# Patient Record
Sex: Male | Born: 1980 | Race: Black or African American | Hispanic: No | Marital: Single | Smoking: Current every day smoker
Health system: Southern US, Community
[De-identification: ages and names within clinical notes are randomized; demographics above are authoritative.]

## PROBLEM LIST (undated history)

## (undated) DIAGNOSIS — H4020X0 Unspecified primary angle-closure glaucoma, stage unspecified: Secondary | ICD-10-CM

## (undated) DIAGNOSIS — I10 Essential (primary) hypertension: Secondary | ICD-10-CM

---

## 2012-10-19 ENCOUNTER — Emergency Department (HOSPITAL_COMMUNITY)
Admission: EM | Admit: 2012-10-19 | Discharge: 2012-10-19 | Disposition: A | Discharge status: Home / Self Care | Payer: Self-pay | Attending: Emergency Medicine | Admitting: Emergency Medicine

## 2012-10-19 ENCOUNTER — Encounter (HOSPITAL_COMMUNITY): Payer: Self-pay | Admitting: Adult Health

## 2012-10-19 DIAGNOSIS — H5710 Ocular pain, unspecified eye: Secondary | ICD-10-CM | POA: Insufficient documentation

## 2012-10-19 DIAGNOSIS — I10 Essential (primary) hypertension: Secondary | ICD-10-CM | POA: Insufficient documentation

## 2012-10-19 DIAGNOSIS — F17200 Nicotine dependence, unspecified, uncomplicated: Secondary | ICD-10-CM | POA: Insufficient documentation

## 2012-10-19 HISTORY — DX: Essential (primary) hypertension: I10

## 2012-10-19 MED ORDER — TETRACAINE HCL 0.5 % OP SOLN
2.0000 [drp] | Freq: Once | OPHTHALMIC | Status: AC
  Administered 2012-10-19: 2 [drp] via OPHTHALMIC
  Filled 2012-10-19: qty 2

## 2012-10-19 MED ORDER — ONDANSETRON 4 MG PO TBDP
4.0000 mg | ORAL_TABLET | Freq: Once | ORAL | Status: AC
  Administered 2012-10-19: 4 mg via ORAL
  Filled 2012-10-19: qty 1

## 2012-10-19 MED ORDER — OXYCODONE-ACETAMINOPHEN 5-325 MG PO TABS
2.0000 | ORAL_TABLET | Freq: Once | ORAL | Status: AC
  Administered 2012-10-19: 2 via ORAL
  Filled 2012-10-19: qty 2

## 2012-10-19 MED ORDER — KETOROLAC TROMETHAMINE 0.5 % OP SOLN
1.0000 [drp] | Freq: Four times a day (QID) | OPHTHALMIC | Status: DC
  Administered 2012-10-19: 1 [drp] via OPHTHALMIC
  Filled 2012-10-19: qty 3

## 2012-10-19 MED ORDER — BRIMONIDINE TARTRATE 0.2 % OP SOLN
1.0000 [drp] | Freq: Three times a day (TID) | OPHTHALMIC | Status: DC
  Administered 2012-10-19: 1 [drp] via OPHTHALMIC
  Filled 2012-10-19: qty 5

## 2012-10-19 NOTE — ED Provider Notes (Signed)
Medical screening examination/treatment/procedure(s) were performed by non-physician practitioner and as supervising physician I was immediately available for consultation/collaboration.   Dione Booze, MD 10/19/12 2312

## 2012-10-19 NOTE — ED Provider Notes (Signed)
History     CSN: [id number]  Arrival date & time 10/19/12  4098   First MD Initiated Contact with Patient 10/19/12 2024      Chief Complaint  Patient presents with  . Headache    (Consider location/radiation/quality/duration/timing/severity/associated sxs/prior treatment) HPI   In patient with a history of hypertension presents to the emergency department with complaints of left eye pain and tearing. He says that this has been happening on and off for the past 10 years. The episodes are infrequent. He says that his eye is sensitive to the light and that it tears up when the pain is the worst. He normally the pain resolved on its own. Currently he complains of blurred vision in his left eye which is resolving since the pain is going away. His blood pressure is 161/102 in the ER he is having no other symptoms. nad    Past Medical History  Diagnosis Date  . Hypertension     History reviewed. No pertinent past surgical history.  History reviewed. No pertinent family history.  History  Substance Use Topics  . Smoking status: Current Every Day Smoker    Types: Cigarettes  . Smokeless tobacco: Not on file  . Alcohol Use: Yes      Review of Systems  Review of Systems  Gen: no weight loss, fevers, chills, night sweats  Eyes: no discharge or drainage, + occular pain and visual changes  Nose: no epistaxis or rhinorrhea  Mouth: no dental pain, no sore throat  Neck: no neck pain  Lungs:No wheezing, coughing or hemoptysis CV: no chest pain, palpitations, dependent edema or orthopnea  Abd: no abdominal pain, nausea, vomiting  GU: no dysuria or gross hematuria  MSK:  No abnormalities  Neuro: no headache, no focal neurologic deficits  Skin: no abnormalities Psyche: negative.   Allergies  Poison oak extract  Home Medications  No current outpatient prescriptions on file.  BP 150/92  Pulse 77  Temp 98.4 F (36.9 C) (Oral)  Resp 20  SpO2 100%  Physical Exam  Nursing  note and vitals reviewed. Constitutional: He appears well-developed and well-nourished. No distress.  HENT:  Head: Normocephalic and atraumatic.  Eyes: Conjunctivae normal, EOM and lids are normal. Pupils are equal, round, and reactive to light.       Pt has physiologic red reflex.  Neck: Normal range of motion. Neck supple.  Cardiovascular: Normal rate and regular rhythm.   Pulmonary/Chest: Effort normal.  Abdominal: Soft.  Neurological: He is alert.  Skin: Skin is warm and dry.    ED Course  Procedures (including critical care time)  Labs Reviewed - No data to display No results found.   1. Eye pain       MDM  Pt given 2 percocet. When eye pain resolved his head pain behind the eye resolved as well. The tonometer in the ER would not work and we could not find a replacement. I am most concerned about a closer glaucoma. I have consulted with Dr. Delaney Meigs who has agree to see patient tomorrow morning at 9:15am in his office. He has asked me to start him on Alphagan and Ketoralac drops Optic.  Vision is 20/20 right eye 20/30 left eye and 20/20 both eyes/  I have discussed this with the patient and his fiance and they have agreed to go to office tomorrow morning.  Pt has been advised of the symptoms that warrant their return to the ED. Patient has voiced understanding and has agreed to  follow-up with the PCP or specialist.        Dorthula Matas, PA 10/19/12 2308

## 2012-10-19 NOTE — ED Notes (Signed)
Pt reports left eye pain that is associated with a headache,  Pt staes the headache comes first and then the eye pain begins. began at 140 today, pt states, "It hurts to open my left eye and it starts to tear up then I Can't see or do anything. This has been off and on for 10 years"  PT reports being sensitive only to light in the left eye. Denies injury. Bilateral eyes red. C/o blurred vision in left.  Pt is hypertensive 161/102

## 2014-07-07 ENCOUNTER — Encounter (HOSPITAL_COMMUNITY): Payer: Self-pay | Admitting: Emergency Medicine

## 2014-07-07 ENCOUNTER — Emergency Department (HOSPITAL_COMMUNITY)
Admission: EM | Admit: 2014-07-07 | Discharge: 2014-07-08 | Disposition: A | Discharge status: Home / Self Care | Payer: Self-pay | Attending: Emergency Medicine | Admitting: Emergency Medicine

## 2014-07-07 ENCOUNTER — Emergency Department (HOSPITAL_COMMUNITY): Payer: Self-pay

## 2014-07-07 DIAGNOSIS — F17200 Nicotine dependence, unspecified, uncomplicated: Secondary | ICD-10-CM | POA: Insufficient documentation

## 2014-07-07 DIAGNOSIS — Y929 Unspecified place or not applicable: Secondary | ICD-10-CM | POA: Insufficient documentation

## 2014-07-07 DIAGNOSIS — S63659A Sprain of metacarpophalangeal joint of unspecified finger, initial encounter: Secondary | ICD-10-CM | POA: Insufficient documentation

## 2014-07-07 DIAGNOSIS — I10 Essential (primary) hypertension: Secondary | ICD-10-CM | POA: Insufficient documentation

## 2014-07-07 DIAGNOSIS — Y9389 Activity, other specified: Secondary | ICD-10-CM | POA: Insufficient documentation

## 2014-07-07 DIAGNOSIS — W2209XA Striking against other stationary object, initial encounter: Secondary | ICD-10-CM | POA: Insufficient documentation

## 2014-07-07 DIAGNOSIS — S6990XA Unspecified injury of unspecified wrist, hand and finger(s), initial encounter: Secondary | ICD-10-CM | POA: Insufficient documentation

## 2014-07-07 NOTE — ED Notes (Signed)
Pt's wife reports pt hit a wooden banister x 3 weeks ago, started to complain tonight of L hand pain.  Mild swelling noted in his L hand.  No difficulty using his L hand.

## 2014-07-07 NOTE — ED Provider Notes (Signed)
CSN: [id number]     Arrival date & time 07/07/14  2332 History  This chart was scribed for non-physician provider Antony MaduraKelly Travone Georg, PA-C, working with Layla MawKristen N Ward, DO by Phillis HaggisGabriella Gaje, ED Scribe. This patient was seen in room WTR7/WTR7 and patient care was started at 11:44 PM.   Chief Complaint  Patient presents with  . Hand Pain   The history is provided by the patient. No language interpreter was used.   HPI Comments: Todd Macdonald is a 33 y.o. male who presents to the Emergency Department complaining of left hand pain onset 1.5 months ago. He states that he hit a wooden bannister and the swelling has not gone down since. He states that he has no pain in the arm on a regular basis but has pain on palpation. He reports that he has not taken anything for the pain. He reports that he did not want to be seen at the ED for pain, but was told numerous times by friends to come and see someone. He reports that he noticed discoloration in the left hand but states that it has since lessened.   Past Medical History  Diagnosis Date  . Hypertension    History reviewed. No pertinent past surgical history. No family history on file. History  Substance Use Topics  . Smoking status: Current Every Day Smoker    Types: Cigarettes  . Smokeless tobacco: Not on file  . Alcohol Use: Yes    Review of Systems  Musculoskeletal: Positive for arthralgias.  All other systems reviewed and are negative.   Allergies  Poison oak extract  Home Medications   Prior to Admission medications   Not on File   BP 141/92  Pulse 85  Temp(Src) 98.7 F (37.1 C) (Oral)  Resp 20  SpO2 100%  Physical Exam  Nursing note and vitals reviewed. Constitutional: He is oriented to person, place, and time. He appears well-developed and well-nourished. No distress.  Nontoxic/nonseptic appearing  HENT:  Head: Normocephalic and atraumatic.  Eyes: Conjunctivae and EOM are normal. No scleral icterus.  Neck: Normal range  of motion. Neck supple.  Cardiovascular: Normal rate, regular rhythm and intact distal pulses.   Distal radial pulse 2+ in left upper extremity. Capillary refill brisk in all digits  Pulmonary/Chest: Effort normal. No respiratory distress.  Musculoskeletal: Normal range of motion. He exhibits tenderness.  Tenderness to palpation of the third MCP joint of the left hand. Mild swelling appreciated. No deformity or crepitus.  Neurological: He is alert and oriented to person, place, and time. He exhibits normal muscle tone. Coordination normal.  No gross sensory deficits appreciated. Finger to thumb opposition intact. Grip strength normal and equal.  Skin: Skin is warm and dry. No rash noted. He is not diaphoretic. No erythema. No pallor.  Psychiatric: He has a normal mood and affect. His behavior is normal.    ED Course  Procedures (including critical care time) DIAGNOSTIC STUDIES: Oxygen Saturation is 100% on room air, normal by my interpretation.    COORDINATION OF CARE: 11:47 PM-Discussed treatment plan which includes  X-ray with pt at bedside and pt agreed to plan.   Labs Review Labs Reviewed - No data to display  Imaging Review Dg Hand Complete Left  07/08/2014   CLINICAL DATA:  Left hand pain over the third MCP joint after punched a wall 3 weeks ago.  EXAM: LEFT HAND - COMPLETE 3+ VIEW  COMPARISON:  None.  FINDINGS: Soft tissue swelling over the MCP joint  region. No evidence of acute fracture or dislocation in the left hand. No focal bone lesion or bone destruction. No radiopaque soft tissue foreign bodies.  IMPRESSION: Soft tissue swelling.  No acute bony abnormalities identified.   Electronically Signed   By: Burman Nieves M.D.   On: 07/08/2014 00:08     EKG Interpretation None      MDM   Final diagnoses:  Sprain of MCP joint of hand, initial encounter    Uncomplicated sprain of MCP joint. Patient neurovascularly intact. No gross sensory deficits appreciated. Imaging  today negative for fracture or dislocation. Patient stable for discharge with instructions for RICE and ibuprofen. Return precautions discussed and provided. Patient agreeable to plan with no unaddressed concerns.  I personally performed the services described in this documentation, which was scribed in my presence. The recorded information has been reviewed and is accurate.   Filed Vitals:   07/07/14 2350  BP: 141/92  Pulse: 85  Temp: 98.7 F (37.1 C)  TempSrc: Oral  Resp: 20  SpO2: 100%        Antony Madura, PA-C 07/08/14 [phone]

## 2014-07-08 NOTE — ED Provider Notes (Signed)
Medical screening examination/treatment/procedure(s) were performed by non-physician practitioner and as supervising physician I was immediately available for consultation/collaboration.   EKG Interpretation None        Layla MawKristen N Ward, DO 07/08/14 [id number]

## 2014-07-08 NOTE — Discharge Instructions (Signed)
Intermetacarpal Sprain °The intermetacarpal ligaments run between the knuckles at the base of the fingers. These ligaments are vulnerable to sprain and injury in which the ligament becomes overstretched or torn. Intermetacarpal sprains are classified into 3 categories. Grade 1 sprains cause pain, but the tendon is not lengthened. Grade 2 sprains include a lengthened ligament, due to the ligament being stretched or partially ruptured. With grade 2 sprains there is still function, although function may be decreased. Grade 3 sprains include a complete tear of the ligament, and the joint usually displays a loss of function.  °SYMPTOMS  °· Severe pain at the time of injury. °· Often, a feeling of popping or tearing inside the hand. °· Tenderness and inflammation at the knuckles. °· Bruising within a couple days of injury. °· Impaired ability to use the hand. °CAUSES  °This condition occurs when the intermetacarpal ligaments are subjected to a greater stress than they can handle. This causes the ligaments to become stretched or torn. °RISK INCREASES WITH: °· Previous hand injury. °· Fighting sports (boxing, wrestling, martial arts). °· Sports in which you could fall on an outstretched hand (soccer, basketball, volleyball). °· Other sports with repeated hand trauma (water polo, gymnastics). °· Poor hand strength and flexibility. °· Inadequate or poorly fitted protective equipment. °PREVENTION  °· Warm up and stretch properly before activity. °· Maintain appropriate conditioning: °¨ Hand flexibility. °¨ Muscle strength and endurance. °· Applying tape, protective strapping, or a brace may help prevent injury. °· Provide the hand with support during sports and practice activities for 6 to 12 months following injury. °PROGNOSIS  °With proper treatment, healing should occur without impairment. The length of healing varies from 2 to 12 weeks, depending on the severity of injury. °RELATED COMPLICATIONS  °· Longer healing time, if  activities are resumed too soon. °· Recurring symptoms or repeated injury, resulting in a chronic problem. °· Injury to other nearby structures (bone, cartilage, tendon). °· Arthritis of the knuckle (intermetacarpal) joint, with repeated sprains. °· Prolonged disability (sometimes). °· Hand and finger stiffness or weakness. °TREATMENT °Treatment first involves ice and medicine to reduce pain and inflammation. An elastic compression bandage may be worn to reduce discomfort and to protect the area. Depending on the severity of injury, you may be required to restrain the area with a cast, splint, or brace. After the ligament has been allowed to heal, strengthening and stretching exercises may be needed to regain strength and a full range of motion. Exercises may be completed at home or with a therapist. Surgery is rarely needed. °MEDICATION  °· If pain medicine is needed, nonsteroidal anti-inflammatory medicines (aspirin and ibuprofen), or other minor pain relievers (acetaminophen), are often advised. °· Do not take pain medicine for 7 days before surgery. °· Stronger pain relievers may be prescribed if your caregiver thinks they are needed. Use only as directed and only as much as you need. °HEAT AND COLD °· Cold treatment (icing) should be applied for 10 to 15 minutes every 2 to 3 hours for inflammation and pain, and immediately after activity that aggravates your symptoms. Use ice packs or an ice massage. °· Heat treatment may be used before performing stretching and strengthening activities prescribed by your caregiver, physical therapist, or athletic trainer. Use a heat pack or a warm water soak. °SEEK MEDICAL CARE IF:  °· Symptoms remain or get worse, despite treatment for longer than 2 to 4 weeks. °· You experience pain, numbness, discoloration, or coldness in the hand or fingers. °·   You develop blue, gray, or dark fingernails. °· Any of the following occur after surgery: increased pain, swelling, redness,  drainage of fluids, bleeding in the affected area, or signs of infection, including fever. °· New, unexplained symptoms develop. (Drugs used in treatment may produce side effects.) °Document Released: 11/27/2005 Document Revised: 04/13/2014 Document Reviewed: 03/11/2009 °[employer]® Patient Information ©2015 [employer]. This information is not intended to replace advice given to you by your health care provider. Make sure you discuss any questions you have with your health care provider. ° °

## 2017-11-12 ENCOUNTER — Emergency Department (HOSPITAL_COMMUNITY)
Admission: EM | Admit: 2017-11-12 | Discharge: 2017-11-12 | Disposition: A | Discharge status: Home / Self Care | Payer: BLUE CROSS/BLUE SHIELD | Attending: Emergency Medicine | Admitting: Emergency Medicine

## 2017-11-12 ENCOUNTER — Other Ambulatory Visit: Payer: Self-pay

## 2017-11-12 ENCOUNTER — Emergency Department (HOSPITAL_COMMUNITY): Payer: BLUE CROSS/BLUE SHIELD

## 2017-11-12 ENCOUNTER — Encounter (HOSPITAL_COMMUNITY): Payer: Self-pay | Admitting: Emergency Medicine

## 2017-11-12 DIAGNOSIS — Y929 Unspecified place or not applicable: Secondary | ICD-10-CM | POA: Diagnosis not present

## 2017-11-12 DIAGNOSIS — S4991XA Unspecified injury of right shoulder and upper arm, initial encounter: Secondary | ICD-10-CM | POA: Diagnosis present

## 2017-11-12 DIAGNOSIS — Y9389 Activity, other specified: Secondary | ICD-10-CM | POA: Insufficient documentation

## 2017-11-12 DIAGNOSIS — F17210 Nicotine dependence, cigarettes, uncomplicated: Secondary | ICD-10-CM | POA: Insufficient documentation

## 2017-11-12 DIAGNOSIS — X509XXA Other and unspecified overexertion or strenuous movements or postures, initial encounter: Secondary | ICD-10-CM | POA: Insufficient documentation

## 2017-11-12 DIAGNOSIS — Y999 Unspecified external cause status: Secondary | ICD-10-CM | POA: Insufficient documentation

## 2017-11-12 DIAGNOSIS — S4491XA Injury of unspecified nerve at shoulder and upper arm level, right arm, initial encounter: Secondary | ICD-10-CM | POA: Diagnosis not present

## 2017-11-12 DIAGNOSIS — R200 Anesthesia of skin: Secondary | ICD-10-CM | POA: Diagnosis not present

## 2017-11-12 DIAGNOSIS — I10 Essential (primary) hypertension: Secondary | ICD-10-CM | POA: Insufficient documentation

## 2017-11-12 NOTE — ED Provider Notes (Signed)
[facility] Provider Note   CSN: [id number] Arrival date & time: 11/12/17  1205     History   Chief Complaint Chief Complaint  Patient presents with  . Numbness    HPI Todd Macdonald is a 36 y.o. male with a history of hypertension who presents the emergency department today for numbness and weakness of the lower right dominant arm.  Patient states that yesterday evening he fell asleep in his arm chair around 10pm. He awoke around 2:30 AM this morning with a numbness/tingling sensation in his arm.  He states the sensation goes from his entire arm distal to the elbow, into the wrist and then localizes to the thumb.  He notes that he has decreased range of motion of the wrist with extension and also of the thumb.  He notes that he had 4 tall boys prior to sleeping.  He is also had 2 this morning.  No paresthesia's prior to today.  He has never had similar in the past. He denies fever, chills, neck pain, or trauma.   Patient works in Product managerpackaging at [employer] and says that he is throwing practices constantly at work.  He denies any injury while at work.  HPI  Past Medical History:  Diagnosis Date  . Hypertension     There are no active problems to display for this patient.   History reviewed. No pertinent surgical history.     Home Medications    Prior to Admission medications   Not on File    Family History History reviewed. No pertinent family history.  Social History Social History   Tobacco Use  . Smoking status: Current Every Day Smoker    Types: Cigarettes  . Smokeless tobacco: Never Used  Substance Use Topics  . Alcohol use: Yes  . Drug use: No     Allergies   Poison oak extract [poison oak extract]   Review of Systems Review of Systems  All other systems reviewed and are negative.    Physical Exam Updated Vital Signs BP (!) 148/94 (BP Location: Right Arm)   Pulse 88   Temp 98.3 F (36.8 C) (Oral)   Resp  18   SpO2 100%   Physical Exam  Constitutional: He appears well-developed and well-nourished.  HENT:  Head: Normocephalic and atraumatic.  Right Ear: External ear normal.  Left Ear: External ear normal.  Eyes: Conjunctivae are normal. Right eye exhibits no discharge. Left eye exhibits no discharge. No scleral icterus.  Pulmonary/Chest: Effort normal. No respiratory distress.  Musculoskeletal:  Cervical Spine: Appearance normal. No obvious bony deformity. No skin swelling, erythema, heat, fluctuance or break of the skin. No TTP over the cervical spinous processes. No paraspinal tenderness. No step-offs. Patient is able to actively rotate their neck 45 degrees left and right voluntarily without pain and flex and extend the neck without pain. Negative Spurling's  Right Shoulder: Appearance normal. No obvious bony deformity. No skin swelling, erythema, heat, fluctuance or break of the skin. No clavicular deformity or TTP. No TTP. Active ROM for flexion, extension, abduction, adduction, and internal/external rotation. Strength intact bilaterally. Right Elbow: Appearance normal. No obvious bony deformity. No skin swelling, erythema, heat, fluctuance or break of the skin. No TTP over joint. Active flexion, extension, supination and pronation full and intact without pain. Strength able and appropriate for age for flexion and extension. Sensation intact to shoulder and upper arm.  Right Forearm:  Appearance normal. No obvious bony deformity. No skin swelling,  erythema, heat, fluctuance or break of the skin. No TTP of radius or ulna. Patient can supinate and pronate the forearm.  Sensation intact to light touch for forearm.  Right hand: No gross deformities, skin intact. Fingers appear normal. No TTP over wrist, hand or fingers. Patient with decreased grip and wrist flexion strength of right compared to left. Will not demonstrate wrist extension against gravity or resistance but does show minimal tone when  letting go of wrist from extension against gravity. Patient able to demonstrate active flexion and extension of all digits. Thumb opposition intact.  SILT in M/U/R distributions. Radial Pulse 2+. Cap refill <2 seconds. Compartments soft.   Neurological: He is alert.  Reflex Scores:      Tricep reflexes are 2+ on the right side and 2+ on the left side.      Bicep reflexes are 2+ on the right side and 2+ on the left side.      Brachioradialis reflexes are 2+ on the right side and 2+ on the left side. Mental Status:  Alert, oriented, thought content appropriate, able to give a coherent history. Speech fluent without evidence of aphasia. Able to follow 2 step commands without difficulty.  Cranial Nerves:  II:  Peripheral visual fields grossly normal, pupils equal, round, reactive to light III,IV, VI: ptosis not present, extra-ocular motions intact bilaterally  V,VII: smile symmetric, eyebrows raise symmetric, facial light touch sensation equal VIII: hearing grossly normal to voice  X: uvula elevates symmetrically  XI: bilateral shoulder shrug symmetric and strong XII: midline tongue extension without fassiculations Motor:  Normal tone. 5/5 lower extremities bilaterally including strong and equal grip strength and dorsiflexion/plantar flexion. Decreased grip strength of right. Intact strength b/l of right shoulder abduction, and elbow flexion and extension.  Sensory: Sensation intact to light touch in all extremities. Negative Romberg.  Deep Tendon Reflexes: 2+ of triceps, biceps and brachioradials b/l.  Gait: normal gait   Skin: No pallor.  Psychiatric: He has a normal mood and affect.  Nursing note and vitals reviewed.    ED Treatments / Results  Labs (all labs ordered are listed, but only abnormal results are displayed) Labs Reviewed - No data to display  EKG  EKG Interpretation None       Radiology Ct Head Wo Contrast  Result Date: 11/12/2017 CLINICAL DATA:  36 year old male  presenting with numbness and tingling in the right extremity since 0230 hours after drinking last evening. EXAM: CT HEAD WITHOUT CONTRAST TECHNIQUE: Contiguous axial images were obtained from the base of the skull through the vertex without intravenous contrast. COMPARISON:  None. FINDINGS: Brain: No evidence of acute infarction, hemorrhage, hydrocephalus, extra-axial collection or mass lesion/mass effect. Vascular: No hyperdense vessel or unexpected calcification. Skull: Normal. Negative for fracture or focal lesion. Sinuses/Orbits: No acute finding. Mild ethmoid sinus mucosal thickening with small mucous retention cyst in the left maxillary sinus. Clear mastoids. Intact orbits and globes. Other: None IMPRESSION: No acute intracranial abnormality. Electronically Signed   By: Tollie Eth M.D.   On: 11/12/2017 14:30    Procedures Procedures (including critical care time)  Medications Ordered in ED Medications - No data to display   Initial Impression / Assessment and Plan / ED Course  I have reviewed the triage vital signs and the nursing notes.  Pertinent labs & imaging results that were available during my care of the patient were reviewed by me and considered in my medical decision making (see chart for details).     36  y.o. male presenting for numbness/tingling of right lower forearm, distal to the elbow into the right thumb. He also notes new weakness with wrist extension. The patient was drinking last night and fell asleep with arm hanging on arm rest.  Patient is without neck pain and has a negative Spurling's test.  Intact shoulder and elbow range of motion and strength.  Intact deep tendon reflexes for the tricep, bicep and brachioradialis bilaterally.  Patient has intact sensation to light touch for the axillary, median, ulnar and radial nerve distributions.  Patient is noted to have decreased grip strength of the right compared to the left.  He also has slightly decreased wrist flexion  strength of the right compared to left.  Patient does not demonstrate wrist extension against gravity or resistance with formal testing. He does show some good tone of the wrist when not formally testing. Patient with 2+ radial pulse and good cap refill. Compartments are soft. CT head ordered to r/o central lesion. This was reassuring. Suspect neuropraxia. Will provide shoulder sling for comfort and referral to neurology. Patient given note off work for the next 3 days. Patient given return precautions and information regarding their diagnosis. Strict return precautions discussed. They are in agreement with plan. Patient case discussed with Dr. Lynelle DoctorKnapp who helped guide the workup and treatment. He is in agreement with plan.   Final Clinical Impressions(s) / ED Diagnoses   Final diagnoses:  Neuropraxia of right upper extremity, initial encounter    ED Discharge Orders    None       Princella PellegriniMaczis, Billyjack Trompeter M, PA-C 11/12/17 1622    Linwood DibblesKnapp, Jon, MD 11/13/17 [phone]

## 2017-11-12 NOTE — ED Notes (Signed)
Patient transported to CT 

## 2017-11-12 NOTE — ED Triage Notes (Signed)
States numbness began at 2:30 am while asleep. Also states has been drinking today. VSS.

## 2017-11-12 NOTE — ED Triage Notes (Signed)
Pt to ER for evaluation of right forearm numbness. States unable to move his hand, sensation lighter to right arm than left, no weakness noted, noted patient able to remove clothing with hand.  Pt denies pain. +2 radial pulses bilaterally, cap refill <3.

## 2017-11-12 NOTE — Discharge Instructions (Signed)
You were seen here today for right arm numbness and tingling. A CT scan of your head was done and was reassuring. Your symptoms are consistent with a condition called neuropraxia. It takes time and rest for this to heal. Please follow up with neurology. Please wear sling as directed for comfort. Make sure to take shoulder out of sling and perform shoulder range of motion exercises at least once per day in order to prevent stiffening of your shoulder. If you develop worsening or new concerning symptoms you can return to the emergency department for re-evaluation.

## 2018-04-28 ENCOUNTER — Other Ambulatory Visit: Payer: Self-pay

## 2018-04-28 ENCOUNTER — Emergency Department (HOSPITAL_COMMUNITY)
Admission: EM | Admit: 2018-04-28 | Discharge: 2018-04-28 | Disposition: A | Discharge status: Home / Self Care | Payer: BLUE CROSS/BLUE SHIELD | Attending: Emergency Medicine | Admitting: Emergency Medicine

## 2018-04-28 ENCOUNTER — Encounter (HOSPITAL_COMMUNITY): Payer: Self-pay

## 2018-04-28 DIAGNOSIS — K047 Periapical abscess without sinus: Secondary | ICD-10-CM | POA: Insufficient documentation

## 2018-04-28 DIAGNOSIS — F17210 Nicotine dependence, cigarettes, uncomplicated: Secondary | ICD-10-CM | POA: Diagnosis not present

## 2018-04-28 DIAGNOSIS — I10 Essential (primary) hypertension: Secondary | ICD-10-CM | POA: Diagnosis not present

## 2018-04-28 DIAGNOSIS — K0889 Other specified disorders of teeth and supporting structures: Secondary | ICD-10-CM | POA: Diagnosis present

## 2018-04-28 HISTORY — DX: Unspecified primary angle-closure glaucoma, stage unspecified: H40.20X0

## 2018-04-28 MED ORDER — HYDROCODONE-ACETAMINOPHEN 5-325 MG PO TABS
1.0000 | ORAL_TABLET | Freq: Once | ORAL | Status: AC
  Administered 2018-04-28: 1 via ORAL
  Filled 2018-04-28: qty 1

## 2018-04-28 MED ORDER — IBUPROFEN 800 MG PO TABS: 800.0000 mg | ORAL_TABLET | Freq: Three times a day (TID) | ORAL | 0 refills | Status: AC | PRN

## 2018-04-28 MED ORDER — PENICILLIN V POTASSIUM 500 MG PO TABS: 500.0000 mg | ORAL_TABLET | Freq: Four times a day (QID) | ORAL | 0 refills | Status: DC

## 2018-04-28 MED ORDER — TRAMADOL HCL 50 MG PO TABS: 50.0000 mg | ORAL_TABLET | Freq: Four times a day (QID) | ORAL | 0 refills | Status: DC | PRN

## 2018-04-28 MED ORDER — IBUPROFEN 800 MG PO TABS
800.0000 mg | ORAL_TABLET | Freq: Once | ORAL | Status: AC
  Administered 2018-04-28: 800 mg via ORAL
  Filled 2018-04-28: qty 1

## 2018-04-28 NOTE — ED Provider Notes (Signed)
[facility]-EMERGENCY DEPT Provider Note   CSN: [id number] Arrival date & time: 04/28/18  0510     History   Chief Complaint Chief Complaint  Patient presents with  . Dental Pain    HPI Todd Macdonald is a 37 y.o. male.  HPI Patient presents to the emergency department with dental abscess that started 1 week ago.  The patient has swelling to the jawline on the right.  The patient states that he has multiple teeth that are decaying.  The patient states that the pain is worse with palpation.  States he did not take any medications prior to arrival.  Patient denies throat swelling, difficulty swallowing difficulty breathing tongue swelling, fever, nausea, vomiting, or syncope. Past Medical History:  Diagnosis Date  . Closed angle glaucoma   . Hypertension     There are no active problems to display for this patient.   History reviewed. No pertinent surgical history.      Home Medications    Prior to Admission medications   Not on File    Family History No family history on file.  Social History Social History   Tobacco Use  . Smoking status: Current Every Day Smoker    Types: Cigarettes  . Smokeless tobacco: Never Used  Substance Use Topics  . Alcohol use: Yes  . Drug use: No     Allergies   Poison oak extract [poison oak extract]   Review of Systems Review of Systems All other systems negative except as documented in the HPI. All pertinent positives and negatives as reviewed in the HPI.  Physical Exam Updated Vital Signs BP 138/86 (BP Location: Left Arm)   Pulse 92   Temp 98.8 F (37.1 C) (Oral)   Resp 16   Ht 6' (1.829 m)   Wt 77.1 kg (170 lb)   SpO2 99%   BMI 23.06 kg/m   Physical Exam  Constitutional: He is oriented to person, place, and time. He appears well-developed and well-nourished. No distress.  HENT:  Head: Normocephalic and atraumatic.  Mouth/Throat: Uvula is midline. No trismus in the jaw. Abnormal  dentition. Dental abscesses and dental caries present. No uvula swelling.  Eyes: Pupils are equal, round, and reactive to light.  Pulmonary/Chest: Effort normal.  Neurological: He is alert and oriented to person, place, and time.  Skin: Skin is warm and dry.  Psychiatric: He has a normal mood and affect.  Nursing note and vitals reviewed.    ED Treatments / Results  Labs (all labs ordered are listed, but only abnormal results are displayed) Labs Reviewed - No data to display  EKG None  Radiology No results found.  Procedures Procedures (including critical care time)  Medications Ordered in ED Medications  HYDROcodone-acetaminophen (NORCO/VICODIN) 5-325 MG per tablet 1 tablet (has no administration in time range)  ibuprofen (ADVIL,MOTRIN) tablet 800 mg (has no administration in time range)     Initial Impression / Assessment and Plan / ED Course  I have reviewed the triage vital signs and the nursing notes.  Pertinent labs & imaging results that were available during my care of the patient were reviewed by me and considered in my medical decision making (see chart for details).     Patient be treated for dental abscess and referred to the dentist on call.  I have advised the patient to return here for any worsening in his condition.  I do not see any abscess that can be drained at this time.  Final Clinical Impressions(s) / ED Diagnoses   Final diagnoses:  None    ED Discharge Orders    None       Charlestine Night, PA-C 04/28/18 9147    Gerhard Munch, MD 04/28/18 [phone]

## 2018-04-28 NOTE — ED Triage Notes (Signed)
Pt coming from home c/o dental abscess that started a week ago in the lower right mouth. Reports pain when swallowing and chewing.

## 2018-04-28 NOTE — Discharge Instructions (Signed)
Return here as needed. Follow up with the dentist provided. °

## 2018-05-02 ENCOUNTER — Inpatient Hospital Stay (HOSPITAL_COMMUNITY)
Admission: EM | Admit: 2018-05-02 | Discharge: 2018-05-04 | DRG: 159 | Disposition: A | Discharge status: Home / Self Care | Payer: BLUE CROSS/BLUE SHIELD | Attending: Family Medicine | Admitting: Family Medicine

## 2018-05-02 ENCOUNTER — Emergency Department (HOSPITAL_COMMUNITY): Payer: BLUE CROSS/BLUE SHIELD

## 2018-05-02 ENCOUNTER — Other Ambulatory Visit: Payer: Self-pay

## 2018-05-02 ENCOUNTER — Encounter (HOSPITAL_COMMUNITY): Payer: Self-pay | Admitting: Emergency Medicine

## 2018-05-02 DIAGNOSIS — K029 Dental caries, unspecified: Secondary | ICD-10-CM | POA: Diagnosis present

## 2018-05-02 DIAGNOSIS — R590 Localized enlarged lymph nodes: Secondary | ICD-10-CM | POA: Diagnosis present

## 2018-05-02 DIAGNOSIS — K047 Periapical abscess without sinus: Secondary | ICD-10-CM | POA: Diagnosis not present

## 2018-05-02 DIAGNOSIS — F17210 Nicotine dependence, cigarettes, uncomplicated: Secondary | ICD-10-CM | POA: Diagnosis present

## 2018-05-02 DIAGNOSIS — I10 Essential (primary) hypertension: Secondary | ICD-10-CM | POA: Diagnosis present

## 2018-05-02 LAB — I-STAT CHEM 8, ED
BUN: 4 mg/dL — AB (ref 6–20)
CALCIUM ION: 1.15 mmol/L (ref 1.15–1.40)
CHLORIDE: 104 mmol/L (ref 101–111)
CREATININE: 0.7 mg/dL (ref 0.61–1.24)
Glucose, Bld: 130 mg/dL — ABNORMAL HIGH (ref 65–99)
HCT: 44 % (ref 39.0–52.0)
Hemoglobin: 15 g/dL (ref 13.0–17.0)
Potassium: 3.6 mmol/L (ref 3.5–5.1)
Sodium: 139 mmol/L (ref 135–145)
TCO2: 25 mmol/L (ref 22–32)

## 2018-05-02 LAB — CBC WITH DIFFERENTIAL/PLATELET
Basophils Absolute: 0 10*3/uL (ref 0.0–0.1)
Basophils Relative: 0 %
EOS PCT: 0 %
Eosinophils Absolute: 0.1 10*3/uL (ref 0.0–0.7)
HEMATOCRIT: 41.4 % (ref 39.0–52.0)
HEMOGLOBIN: 14.7 g/dL (ref 13.0–17.0)
Lymphocytes Relative: 6 %
Lymphs Abs: 1.1 10*3/uL (ref 0.7–4.0)
MCH: 31.9 pg (ref 26.0–34.0)
MCHC: 35.5 g/dL (ref 30.0–36.0)
MCV: 89.8 fL (ref 78.0–100.0)
MONO ABS: 2.2 10*3/uL — AB (ref 0.1–1.0)
Monocytes Relative: 11 %
Neutro Abs: 16.4 10*3/uL — ABNORMAL HIGH (ref 1.7–7.7)
Neutrophils Relative %: 83 %
Platelets: 341 10*3/uL (ref 150–400)
RBC: 4.61 MIL/uL (ref 4.22–5.81)
RDW: 13.3 % (ref 11.5–15.5)
WBC: 19.7 10*3/uL — ABNORMAL HIGH (ref 4.0–10.5)

## 2018-05-02 MED ORDER — CLINDAMYCIN PHOSPHATE 600 MG/50ML IV SOLN
600.0000 mg | Freq: Once | INTRAVENOUS | Status: AC
  Administered 2018-05-02: 600 mg via INTRAVENOUS
  Filled 2018-05-02: qty 50

## 2018-05-02 MED ORDER — LACTATED RINGERS IV SOLN
INTRAVENOUS | Status: DC
  Administered 2018-05-02 – 2018-05-04 (×3): via INTRAVENOUS

## 2018-05-02 MED ORDER — MORPHINE SULFATE (PF) 4 MG/ML IV SOLN
4.0000 mg | Freq: Once | INTRAVENOUS | Status: AC
  Administered 2018-05-02: 4 mg via INTRAVENOUS
  Filled 2018-05-02: qty 1

## 2018-05-02 MED ORDER — CLINDAMYCIN PHOSPHATE 600 MG/50ML IV SOLN
600.0000 mg | Freq: Four times a day (QID) | INTRAVENOUS | Status: DC
  Administered 2018-05-02 – 2018-05-04 (×7): 600 mg via INTRAVENOUS
  Filled 2018-05-02 (×6): qty 50

## 2018-05-02 MED ORDER — IOHEXOL 300 MG/ML  SOLN
75.0000 mL | Freq: Once | INTRAMUSCULAR | Status: AC | PRN
  Administered 2018-05-02: 75 mL via INTRAVENOUS

## 2018-05-02 MED ORDER — SENNOSIDES-DOCUSATE SODIUM 8.6-50 MG PO TABS: 1.0000 | ORAL_TABLET | Freq: Every evening | ORAL | Status: DC | PRN

## 2018-05-02 MED ORDER — DEXAMETHASONE SODIUM PHOSPHATE 4 MG/ML IJ SOLN
8.0000 mg | Freq: Four times a day (QID) | INTRAMUSCULAR | Status: AC
  Administered 2018-05-02 – 2018-05-03 (×3): 8 mg via INTRAVENOUS
  Filled 2018-05-02 (×3): qty 2

## 2018-05-02 MED ORDER — HYDRALAZINE HCL 20 MG/ML IJ SOLN: 5.0000 mg | INTRAMUSCULAR | Status: DC | PRN

## 2018-05-02 MED ORDER — DEXAMETHASONE SODIUM PHOSPHATE 10 MG/ML IJ SOLN
10.0000 mg | Freq: Once | INTRAMUSCULAR | Status: AC
  Administered 2018-05-02: 10 mg via INTRAVENOUS
  Filled 2018-05-02: qty 1

## 2018-05-02 MED ORDER — NICOTINE 21 MG/24HR TD PT24
21.0000 mg | MEDICATED_PATCH | Freq: Every day | TRANSDERMAL | Status: DC
  Administered 2018-05-02 – 2018-05-03 (×2): 21 mg via TRANSDERMAL
  Filled 2018-05-02 (×3): qty 1

## 2018-05-02 MED ORDER — MORPHINE SULFATE (PF) 2 MG/ML IV SOLN
1.0000 mg | INTRAVENOUS | Status: DC | PRN
  Administered 2018-05-02 (×2): 1 mg via INTRAVENOUS
  Filled 2018-05-02 (×2): qty 1

## 2018-05-02 MED ORDER — IBUPROFEN 800 MG PO TABS
800.0000 mg | ORAL_TABLET | Freq: Three times a day (TID) | ORAL | Status: DC | PRN
  Administered 2018-05-02: 800 mg via ORAL
  Filled 2018-05-02 (×2): qty 1

## 2018-05-02 NOTE — ED Provider Notes (Signed)
[facility]-EMERGENCY DEPT Provider Note   CSN: [id number] Arrival date & time: 05/02/18  1409     History   Chief Complaint Chief Complaint  Patient presents with  . Abscess    HPI Todd Macdonald is a 37 y.o. male.  HPI   36 year old male presenting for evaluation of facial pain.  Patient report developing pain and swelling along his right jawline approximately a week and a half ago with dental pain.  Pain is sharp throbbing 8 out of 10 worsening with chewing.  No associated fever, chills, throat swelling, neck pain chest pain or shortness of breath.  He was seen in the ER on 04/28/2018 for his complaint.  He was given penicillin, ibuprofen, and tramadol as treatment.  He has not follow-up with a dentist yet.  He returns today with worsening pain and swelling to the face difficulty opening his mouth.  He denies any injury.  Past Medical History:  Diagnosis Date  . Closed angle glaucoma   . Hypertension     There are no active problems to display for this patient.   History reviewed. No pertinent surgical history.      Home Medications    Prior to Admission medications   Medication Sig Start Date End Date Taking? Authorizing Provider  ibuprofen (ADVIL,MOTRIN) 800 MG tablet Take 1 tablet (800 mg total) by mouth every 8 (eight) hours as needed. 04/28/18  Yes Lawyer, Cristal Deer, PA-C  penicillin v potassium (VEETID) 500 MG tablet Take 1 tablet (500 mg total) by mouth 4 (four) times daily. 04/28/18  Yes Lawyer, Cristal Deer, PA-C  traMADol (ULTRAM) 50 MG tablet Take 1 tablet (50 mg total) by mouth every 6 (six) hours as needed for severe pain. 04/28/18  Yes Lawyer, Cristal Deer, PA-C    Family History No family history on file.  Social History Social History   Tobacco Use  . Smoking status: Current Every Day Smoker    Types: Cigarettes  . Smokeless tobacco: Never Used  Substance Use Topics  . Alcohol use: Yes  . Drug use: No     Allergies     Poison oak extract [poison oak extract]   Review of Systems Review of Systems  All other systems reviewed and are negative.    Physical Exam Updated Vital Signs BP (!) 155/100 (BP Location: Left Arm)   Pulse 95   Temp 99.8 F (37.7 C) (Oral)   Resp 16   Ht 6' (1.829 m)   Wt 81.6 kg (180 lb)   SpO2 99%   BMI 24.41 kg/m   Physical Exam  Constitutional: He appears well-developed and well-nourished. No distress.  HENT:  Head: Atraumatic.  Mouth: Evidence of trismus, unable to open mouth fully.  Significant swelling noted to right side of face along right lower jaw with tenderness to palpation.  No obvious evidence for Ludwig angina.  Eyes: Conjunctivae are normal.  Neck: Neck supple.  No nuchal rigidity  Cardiovascular: Normal rate and regular rhythm.  Pulmonary/Chest: Effort normal and breath sounds normal.  Abdominal: Soft. There is no tenderness.  Lymphadenopathy:    He has cervical adenopathy.  Neurological: He is alert.  Skin: No rash noted.  Psychiatric: He has a normal mood and affect.  Nursing note and vitals reviewed.    ED Treatments / Results  Labs (all labs ordered are listed, but only abnormal results are displayed) Labs Reviewed  CBC WITH DIFFERENTIAL/PLATELET - Abnormal; Notable for the following components:      Result  Value   WBC 19.7 (*)    Neutro Abs 16.4 (*)    Monocytes Absolute 2.2 (*)    All other components within normal limits  I-STAT CHEM 8, ED - Abnormal; Notable for the following components:   BUN 4 (*)    Glucose, Bld 130 (*)    All other components within normal limits    EKG None  Radiology Ct Maxillofacial W Contrast  Result Date: 05/02/2018 CLINICAL DATA:  RIGHT facial swelling. EXAM: CT MAXILLOFACIAL WITH CONTRAST TECHNIQUE: Multidetector CT imaging of the maxillofacial structures was performed with intravenous contrast. Multiplanar CT image reconstructions were also generated. CONTRAST:  75mL OMNIPAQUE IOHEXOL 300 MG/ML   SOLN COMPARISON:  None. FINDINGS: Osseous: There is cortical dehiscence of the medial mandible posteriorly on the RIGHT at the angle of the mandible, related to periodontal disease affecting the RIGHT mandibular molar wisdom tooth. Large area of dental caries affects this tooth as well as the maxillary wisdom tooth. No similar lesions elsewhere. Orbits: Negative Sinuses: Negative Soft tissues: There is a large abscess adjacent to the area of cortical dehiscence affecting the masticator space posterior to the RIGHT mandible. Cross-section is approximately 3 x 3.5 x 2.5 cm. Additional focus of infection underneath the masseter, along the lateral body of the mandible on the RIGHT, small air bubble, see series 3, image 27. RIGHT masseter myositis is present. There is diffuse RIGHT facial swelling. Submental lymphadenopathy, without clear-cut findings of Ludwig's angina. Limited intracranial: Negative. 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 Regional inflammatory change. Extensive RIGHT mandibular and maxillary wisdom tooth dental caries. These results were called by telephone at the time of interpretation on 05/02/2018 at 3:57 pm to PA [facility] , who verbally acknowledged these results. Electronically Signed   By: Elsie Stain M.D.   On: 05/02/2018 15:58    Procedures Procedures (including critical care time)  Medications Ordered in ED Medications  clindamycin (CLEOCIN) IVPB 600 mg (600 mg Intravenous New Bag/Given 05/02/18 1521)  morphine 4 MG/ML injection 4 mg (4 mg Intravenous Given 05/02/18 1521)  dexamethasone (DECADRON) injection 10 mg (10 mg Intravenous Given 05/02/18 1521)  iohexol (OMNIPAQUE) 300 MG/ML solution 75 mL (75 mLs Intravenous Contrast Given 05/02/18 1532)     Initial Impression / Assessment and Plan / ED Course  I have reviewed the triage vital  signs and the nursing notes.  Pertinent labs & imaging results that were available during my care of the patient were reviewed by me and considered in my medical decision making (see chart for details).     BP (!) 155/100 (BP Location: Left Arm)   Pulse 95   Temp 99.8 F (37.7 C) (Oral)   Resp 16   Ht 6' (1.829 m)   Wt 81.6 kg (180 lb)   SpO2 99%   BMI 24.41 kg/m    Final Clinical Impressions(s) / ED Diagnoses   Final diagnoses:  Periapical abscess with facial involvement    ED Discharge Orders    None     2:34 PM Patient here with facial swelling and dental pain suggestive of periapical abscess with facial involvement.  Exam is difficult due to trismus.  Will obtain basic labs, as well as maxillofacial CT scan to rule out deep tissue infection.  Patient given clindamycin and Decadron's treatment.  Pain medication given.  Labs ordered.  4:28 PM Elevated white count  of 19.7, electrolytes are otherwise reassuring.  Maxillofacial CT scan demonstrate.  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  Extensive right mandibular and maxillary wisdom tooth dental caries.  I appreciate consultation from oral surgeon, Dr. Barbette Merino, who acknowledged the results, and request medicine for admission.  Patient to be n.p.o. at midnight and plan for oral surgery tomorrow around 11 AM.  He also recommend continue with clindamycin every 6 hours.  Patient is made aware of plan and agrees with plan.  4:35 PM Appreciate consultation from [facility] Dr. Mahala Menghini who agrees to see and admit pt for further care.     Fayrene Helper, PA-C 05/02/18 1636    Long, Arlyss Repress, MD 05/03/18 1034

## 2018-05-02 NOTE — ED Triage Notes (Signed)
Pt c/o abscess on right side of face not getting any better since Sunday when he was seen here. Reports been taking antibiotics as prescribed.

## 2018-05-02 NOTE — H&P (Signed)
HPI  Todd Macdonald [facility] WUJ:[id number] DOB: [birth year] DOA: 05/02/2018  PCP: Patient, [name]   Chief Complaint: "I have pain in my jaw"  HPI:  36 year old male, hypertension came to emergency room 5/19 diagnosed with dental abscess and was given instruction to follow-up as an outpatient with dentist returns today with worsening pain--Failed pcn and ibuprofen. Patient was discharged with meds which she took religiously He is not able to swallow solid foods and eat chews for 45 minutes before being able to masticate and swallow-he also has only been able to drink liquids for the past week He has had some subjective fevers as well as pain and has been unable to open his mouth  Dental surgery oral surgeon Dr. Barbette Macdonald was consulted based on CT scan findings and is planning to take patient to the OR 5/24 AM    ED Course: Patient given Decadron X1 10 mg, clindamycin 600, IV fluid, pain control in ED  CT scan maxillofacial shows 3X3.5X 2.5 cm focus of infection beneath master and right masseter myositis with submandibular lymphadenopathy  Review of Systems:   + fever, visual changes, sore throat, rash, new muscle aches, chest pain, SOB, dysuria, bleeding, n/v/abdominal pain.  Past Medical History:  Diagnosis Date  . Closed angle glaucoma   . Hypertension     History reviewed. No pertinent surgical history.   reports that he has been smoking cigarettes.  He has never used smokeless tobacco. He reports that he drinks alcohol. He reports that he does not use drugs. Mobility: independent Moved here from [place] 10 years ago Smoked cocaine as a younger man but stopped since Smokes 1 pack/day Drinks 2-3 beers a day  Mother has hypertension-patient does not know father's history Lives with girlfriend and has a 56-month-old baby at home   Allergies  Allergen Reactions  . Poison Oak Extract [Poison Oak Extract] Rash    No family history on file.   Prior to Admission  medications   Medication Sig Start Date End Date Taking? Authorizing Provider  ibuprofen (ADVIL,MOTRIN) 800 MG tablet Take 1 tablet (800 mg total) by mouth every 8 (eight) hours as needed. 04/28/18  Yes Todd Macdonald, Todd Deer, PA-C  penicillin v potassium (VEETID) 500 MG tablet Take 1 tablet (500 mg total) by mouth 4 (four) times daily. 04/28/18  Yes Todd Macdonald, Todd Deer, PA-C  traMADol (ULTRAM) 50 MG tablet Take 1 tablet (50 mg total) by mouth every 6 (six) hours as needed for severe pain. 04/28/18  Yes Todd Macdonald, Todd Deer, PA-C    Physical Exam:  Vitals:   05/02/18 1417  BP: (!) 155/100  Pulse: 95  Resp: 16  Temp: 99.8 F (37.7 C)  SpO2: 99%     EOMI NCAT significant lower jaw swelling with obvious noticeable mass in right jaw unable to fully examine mouth secondary to the same  Unkempt appearance-mild scalelike rash  Chest is clinically clear no added sound  S1-S2 no murmur rub or gallop  Abdomen soft nontender no rebound no guarding  No lower extremity edema  No rash except as above  R OM intact with no focal deficit power 5/5  I have personally reviewed following labs and imaging studies  Labs:   WBC 19.7 hemoglobin 15 glucose 130  Imaging studies:  CT maxillofacial 5/23 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 Regional inflammatory change. Extensive RIGHT mandibular and maxillary wisdom tooth  dental caries.    Medical tests:   EKG independently reviewed: None performed   Test discussed with performing physician:  None  Decision to obtain old records:   Yes  Review and summation of old records:   Yes  Active Problems:   * No active hospital problems. *   Assessment/Plan  Dental abscess-going to the OR in a.m.-continue clindamycin 600 every 6 pain control morphine allowed liquid diet until midnight and then  n.p.o.  HTN-not on meds and probably worsened because of patient's pain-we will start hydralazine for blood pressure >160-we will need outpatient plan for this  Smoker-offered NicoDerm patch if patient willing to take-counseled for about 5 minutes regarding cessation and healing of the wound  Impaired glucose tolerance-test A1c as an outpatient when patient does not have an infection   Severity of Illness: The appropriate patient status for this patient is INPATIENT. Inpatient status is judged to be reasonable and necessary in order to provide the required intensity of service to ensure the patient's safety. The patient's presenting symptoms, physical exam findings, and initial radiographic and laboratory data in the context of their chronic comorbidities is felt to place them at high risk for further clinical deterioration. Furthermore, it is not anticipated that the patient will be medically stable for discharge from the hospital within 2 midnights of admission. The following factors support the patient status of inpatient.   " The patient's presenting symptoms include trismus as well as difficulty swallowing and abscess. " The worrisome physical exam findings include swelling of the face with difficulty with swallowing although he is able to phonate. " The initial radiographic and laboratory data are worrisome because of large abscess. " The chronic co-morbidities include hypertension.   * I certify that at the point of admission it is my clinical judgment that the patient will require inpatient hospital care spanning beyond 2 midnights from the point of admission due to high intensity of service, high risk for further deterioration and high frequency of surveillance required.*     DVT prophylaxis: TED hose Code Status: Full CODE STATUS Family Communication: Discussed with girlfriend at bedside Consults called: Dental surgeon Dr. Barbette Macdonald aware of patient and will be operating  tomorrow  Time spent: 45 minutes  Todd Holzmann, MD  [facility] Direct contact: [phone] --Via amion app OR  --[email or web address]; password [email or web address]  7PM-7AM contact night coverage as above  05/02/2018, 4:31 PM

## 2018-05-02 NOTE — ED Notes (Signed)
Patient transported to CT 

## 2018-05-03 ENCOUNTER — Inpatient Hospital Stay (HOSPITAL_COMMUNITY): Payer: BLUE CROSS/BLUE SHIELD | Admitting: Certified Registered"

## 2018-05-03 ENCOUNTER — Encounter (HOSPITAL_COMMUNITY): Payer: Self-pay | Admitting: Certified Registered"

## 2018-05-03 ENCOUNTER — Encounter (HOSPITAL_COMMUNITY)
Admission: EM | Disposition: A | Discharge status: Home / Self Care | Payer: Self-pay | Source: Home / Self Care | Attending: Family Medicine

## 2018-05-03 HISTORY — PX: TOOTH EXTRACTION: SHX859

## 2018-05-03 LAB — RENAL FUNCTION PANEL
ANION GAP: 11 (ref 5–15)
Albumin: 3.7 g/dL (ref 3.5–5.0)
BUN: 8 mg/dL (ref 6–20)
CALCIUM: 9.3 mg/dL (ref 8.9–10.3)
CHLORIDE: 103 mmol/L (ref 101–111)
CO2: 23 mmol/L (ref 22–32)
Creatinine, Ser: 0.79 mg/dL (ref 0.61–1.24)
Glucose, Bld: 212 mg/dL — ABNORMAL HIGH (ref 65–99)
PHOSPHORUS: 2.3 mg/dL — AB (ref 2.5–4.6)
Potassium: 4.2 mmol/L (ref 3.5–5.1)
Sodium: 137 mmol/L (ref 135–145)

## 2018-05-03 LAB — PROTIME-INR
INR: 1.13
Prothrombin Time: 14.4 seconds (ref 11.4–15.2)

## 2018-05-03 LAB — COMPREHENSIVE METABOLIC PANEL
ALBUMIN: 3.6 g/dL (ref 3.5–5.0)
ALK PHOS: 88 U/L (ref 38–126)
ALT: 18 U/L (ref 17–63)
ANION GAP: 9 (ref 5–15)
AST: 20 U/L (ref 15–41)
BILIRUBIN TOTAL: 0.5 mg/dL (ref 0.3–1.2)
BUN: 8 mg/dL (ref 6–20)
CO2: 25 mmol/L (ref 22–32)
Calcium: 9.3 mg/dL (ref 8.9–10.3)
Chloride: 103 mmol/L (ref 101–111)
Creatinine, Ser: 0.65 mg/dL (ref 0.61–1.24)
GFR calc Af Amer: >60 mL/min (ref >60)
GFR calc non Af Amer: >60 mL/min (ref >60)
GLUCOSE: 213 mg/dL — AB (ref 65–99)
POTASSIUM: 4.3 mmol/L (ref 3.5–5.1)
SODIUM: 137 mmol/L (ref 135–145)
TOTAL PROTEIN: 8 g/dL (ref 6.5–8.1)

## 2018-05-03 LAB — CBC
HEMATOCRIT: 40.2 % (ref 39.0–52.0)
HEMOGLOBIN: 13.9 g/dL (ref 13.0–17.0)
MCH: 30.8 pg (ref 26.0–34.0)
MCHC: 34.6 g/dL (ref 30.0–36.0)
MCV: 89.1 fL (ref 78.0–100.0)
Platelets: 366 10*3/uL (ref 150–400)
RBC: 4.51 MIL/uL (ref 4.22–5.81)
RDW: 13.3 % (ref 11.5–15.5)
WBC: 24.4 10*3/uL — ABNORMAL HIGH (ref 4.0–10.5)

## 2018-05-03 LAB — GLUCOSE, CAPILLARY
GLUCOSE-CAPILLARY: 203 mg/dL — AB (ref 65–99)
Glucose-Capillary: 159 mg/dL — ABNORMAL HIGH (ref 65–99)

## 2018-05-03 LAB — SURGICAL PCR SCREEN
MRSA, PCR: NEGATIVE
Staphylococcus aureus: NEGATIVE

## 2018-05-03 LAB — HIV ANTIBODY (ROUTINE TESTING W REFLEX): HIV Screen 4th Generation wRfx: NONREACTIVE

## 2018-05-03 SURGERY — DENTAL RESTORATION/EXTRACTIONS
Anesthesia: General | Site: Face | Laterality: Right

## 2018-05-03 MED ORDER — LIDOCAINE-EPINEPHRINE 2 %-1:100000 IJ SOLN
INTRAMUSCULAR | Status: DC | PRN
  Administered 2018-05-03: 20 mL

## 2018-05-03 MED ORDER — 0.9 % SODIUM CHLORIDE (POUR BTL) OPTIME
TOPICAL | Status: DC | PRN
  Administered 2018-05-03: 1000 mL

## 2018-05-03 MED ORDER — PROPOFOL 10 MG/ML IV BOLUS
INTRAVENOUS | Status: DC | PRN
  Administered 2018-05-03: 150 mg via INTRAVENOUS

## 2018-05-03 MED ORDER — LACTATED RINGERS IV SOLN
INTRAVENOUS | Status: DC | PRN
  Administered 2018-05-03 (×2): via INTRAVENOUS

## 2018-05-03 MED ORDER — HYDROMORPHONE HCL 1 MG/ML IJ SOLN
INTRAMUSCULAR | Status: AC
  Filled 2018-05-03: qty 1

## 2018-05-03 MED ORDER — HYDROMORPHONE HCL 1 MG/ML IJ SOLN
1.0000 mg | INTRAMUSCULAR | Status: DC | PRN
  Administered 2018-05-03 – 2018-05-04 (×7): 1 mg via INTRAVENOUS
  Filled 2018-05-03 (×8): qty 1

## 2018-05-03 MED ORDER — ONDANSETRON HCL 4 MG/2ML IJ SOLN
INTRAMUSCULAR | Status: DC | PRN
  Administered 2018-05-03: 4 mg via INTRAVENOUS

## 2018-05-03 MED ORDER — ROCURONIUM BROMIDE 10 MG/ML (PF) SYRINGE
PREFILLED_SYRINGE | INTRAVENOUS | Status: DC | PRN
  Administered 2018-05-03: 5 mg via INTRAVENOUS

## 2018-05-03 MED ORDER — FENTANYL CITRATE (PF) 250 MCG/5ML IJ SOLN
INTRAMUSCULAR | Status: DC | PRN
  Administered 2018-05-03 (×4): 50 ug via INTRAVENOUS

## 2018-05-03 MED ORDER — MEPERIDINE HCL 50 MG/ML IJ SOLN: 6.2500 mg | INTRAMUSCULAR | Status: DC | PRN

## 2018-05-03 MED ORDER — MIDAZOLAM HCL 2 MG/2ML IJ SOLN
INTRAMUSCULAR | Status: DC | PRN
  Administered 2018-05-03: 2 mg via INTRAVENOUS

## 2018-05-03 MED ORDER — OXYCODONE-ACETAMINOPHEN 5-325 MG PO TABS
1.0000 | ORAL_TABLET | Freq: Four times a day (QID) | ORAL | Status: DC | PRN
Start: 2018-05-03 — End: 2018-05-03

## 2018-05-03 MED ORDER — LIDOCAINE-EPINEPHRINE 2 %-1:100000 IJ SOLN
INTRAMUSCULAR | Status: AC
  Filled 2018-05-03: qty 2

## 2018-05-03 MED ORDER — OXYCODONE-ACETAMINOPHEN 5-325 MG PO TABS
1.0000 | ORAL_TABLET | Freq: Four times a day (QID) | ORAL | Status: DC | PRN
  Administered 2018-05-04: 2 via ORAL
  Filled 2018-05-03: qty 2

## 2018-05-03 MED ORDER — TRAZODONE HCL 50 MG PO TABS
50.0000 mg | ORAL_TABLET | Freq: Once | ORAL | Status: AC
  Administered 2018-05-03: 50 mg via ORAL
  Filled 2018-05-03: qty 1

## 2018-05-03 MED ORDER — SUCCINYLCHOLINE CHLORIDE 200 MG/10ML IV SOSY
PREFILLED_SYRINGE | INTRAVENOUS | Status: DC | PRN
  Administered 2018-05-03: 100 mg via INTRAVENOUS

## 2018-05-03 MED ORDER — PROMETHAZINE HCL 25 MG/ML IJ SOLN: 6.2500 mg | INTRAMUSCULAR | Status: DC | PRN

## 2018-05-03 MED ORDER — OXYCODONE HCL 5 MG PO TABS: 5.0000 mg | ORAL_TABLET | Freq: Once | ORAL | Status: DC | PRN

## 2018-05-03 MED ORDER — OXYCODONE HCL 5 MG/5ML PO SOLN: 5.0000 mg | Freq: Once | ORAL | Status: DC | PRN

## 2018-05-03 MED ORDER — PROPOFOL 10 MG/ML IV BOLUS
INTRAVENOUS | Status: AC
  Filled 2018-05-03: qty 20

## 2018-05-03 MED ORDER — HYDROMORPHONE HCL 1 MG/ML IJ SOLN
0.2500 mg | INTRAMUSCULAR | Status: DC | PRN
  Administered 2018-05-03 (×2): 0.5 mg via INTRAVENOUS

## 2018-05-03 MED ORDER — LIDOCAINE 2% (20 MG/ML) 5 ML SYRINGE
INTRAMUSCULAR | Status: DC | PRN
  Administered 2018-05-03: 80 mg via INTRAVENOUS

## 2018-05-03 MED ORDER — FENTANYL CITRATE (PF) 250 MCG/5ML IJ SOLN
INTRAMUSCULAR | Status: AC
  Filled 2018-05-03: qty 5

## 2018-05-03 MED ORDER — MIDAZOLAM HCL 2 MG/2ML IJ SOLN
INTRAMUSCULAR | Status: AC
  Filled 2018-05-03: qty 2

## 2018-05-03 SURGICAL SUPPLY — 20 items
BLADE SURG 15 STRL LF DISP TIS (BLADE) ×2 IMPLANT
BLADE SURG 15 STRL SS (BLADE) ×4
GAUZE 4X4 16PLY RFD (DISPOSABLE) ×6 IMPLANT
GAUZE PACKING 2X5 YD STRL (GAUZE/BANDAGES/DRESSINGS) ×3 IMPLANT
GAUZE SPONGE 4X4 12PLY STRL (GAUZE/BANDAGES/DRESSINGS) ×2 IMPLANT
GLOVE BIO SURGEON STRL SZ 6.5 (GLOVE) IMPLANT
GLOVE BIO SURGEON STRL SZ7.5 (GLOVE) ×6 IMPLANT
GLOVE BIO SURGEONS STRL SZ 6.5 (GLOVE)
KIT BASIN OR (CUSTOM PROCEDURE TRAY) ×3 IMPLANT
NDL BLUNT 17GA (NEEDLE) ×1 IMPLANT
NEEDLE BLUNT 17GA (NEEDLE) ×3 IMPLANT
NEEDLE HYPO 22GX1.5 SAFETY (NEEDLE) ×3 IMPLANT
NS IRRIG 1000ML POUR BTL (IV SOLUTION) ×3 IMPLANT
PACK EENT SPLIT (PACKS) ×3 IMPLANT
POSITIONER SURGICAL ARM (MISCELLANEOUS) ×9 IMPLANT
SUT CHROMIC 3 0 PS 2 (SUTURE) ×6 IMPLANT
SYR 50ML LL SCALE MARK (SYRINGE) ×3 IMPLANT
TAPE CLOTH SURG 4X10 WHT LF (GAUZE/BANDAGES/DRESSINGS) ×2 IMPLANT
WATER STERILE IRR 1000ML POUR (IV SOLUTION) ×3 IMPLANT
YANKAUER SUCT BULB TIP 10FT TU (MISCELLANEOUS) ×3 IMPLANT

## 2018-05-03 NOTE — Anesthesia Preprocedure Evaluation (Addendum)
Anesthesia Evaluation  Patient identified by MRN, date of birth, ID band Patient awake    Reviewed: Allergy & Precautions, NPO status , Patient's Chart, lab work & pertinent test results  Airway Mallampati: II  TM Distance: >3 FB Neck ROM: Full    Dental no notable dental hx. (+) Dental Advisory Given   Pulmonary neg pulmonary ROS, Current Smoker,    Pulmonary exam normal breath sounds clear to auscultation       Cardiovascular hypertension, negative cardio ROS Normal cardiovascular exam Rhythm:Regular Rate:Normal     Neuro/Psych negative neurological ROS  negative psych ROS   GI/Hepatic negative GI ROS, Neg liver ROS,   Endo/Other  negative endocrine ROSdiabetes, Type 2  Renal/GU negative Renal ROS  negative genitourinary   Musculoskeletal negative musculoskeletal ROS (+)   Abdominal   Peds negative pediatric ROS (+)  Hematology negative hematology ROS (+)   Anesthesia Other Findings   Reproductive/Obstetrics negative OB ROS                            Anesthesia Physical Anesthesia Plan  ASA: III  Anesthesia Plan: General   Post-op Pain Management:    Induction: Intravenous  PONV Risk Score and Plan: 1 and Ondansetron  Airway Management Planned: Oral ETT  Additional Equipment:   Intra-op Plan:   Post-operative Plan: Extubation in OR  Informed Consent: I have reviewed the patients History and Physical, chart, labs and discussed the procedure including the risks, benefits and alternatives for the proposed anesthesia with the patient or authorized representative who has indicated his/her understanding and acceptance.   Dental advisory given  Plan Discussed with: CRNA  Anesthesia Plan Comments:         Anesthesia Quick Evaluation

## 2018-05-03 NOTE — Transfer of Care (Signed)
Immediate Anesthesia Transfer of Care Note  Patient: Todd Macdonald [facility]  Procedure(s) Performed: incision and drainage jaw with exctration tooth #32 (Right Face)  Patient Location: PACU  Anesthesia Type:General  Level of Consciousness: awake and alert   Airway & Oxygen Therapy: Patient Spontanous Breathing and Patient connected to face mask oxygen  Post-op Assessment: Report given to RN and Post -op Vital signs reviewed and stable  Post vital signs: Reviewed and stable  Last Vitals:  Vitals Value Taken Time  BP 138/95 05/03/2018  8:30 AM  Temp    Pulse 93 05/03/2018  8:33 AM  Resp 22 05/03/2018  8:33 AM  SpO2 100 % 05/03/2018  8:33 AM  Vitals shown include unvalidated device data.  Last Pain:  Vitals:   05/03/18 0525  TempSrc: Oral  PainSc:       Patients Stated Pain Goal: 3 (05/02/18 1922)  Complications: No apparent anesthesia complications

## 2018-05-03 NOTE — Anesthesia Postprocedure Evaluation (Signed)
Anesthesia Post Note  Patient: [name] [facility]  Procedure(s) Performed: incision and drainage jaw with exctration tooth #32 (Right Face)     Patient location during evaluation: PACU Anesthesia Type: General Level of consciousness: awake and alert Pain management: pain level controlled Vital Signs Assessment: post-procedure vital signs reviewed and stable Respiratory status: spontaneous breathing, nonlabored ventilation and respiratory function stable Cardiovascular status: blood pressure returned to baseline and stable Postop Assessment: no apparent nausea or vomiting Anesthetic complications: no    Last Vitals:  Vitals:   05/03/18 0930 05/03/18 0945  BP: 131/85 (!) 147/97  Pulse: 74 71  Resp: 12 12  Temp:  37.2 C  SpO2: 99% 99%    Last Pain:  Vitals:   05/03/18 1005  TempSrc:   PainSc: 5                  Lowella Curb

## 2018-05-03 NOTE — Op Note (Signed)
05/02/2018 - 05/03/2018  8:14 AM  PATIENT:  Todd Macdonald  37 y.o. male  PRE-OPERATIVE DIAGNOSIS: Right mandibular masseter space infection, abscessed tooth # 32  POST-OPERATIVE DIAGNOSIS:  SAME  PROCEDURE:  Procedure(s): incision and drainage right mandible; extraction tooth # 32  SURGEON:  Surgeon(s): Ocie Doyne, DDS  ANESTHESIA:   local and general  EBL:  minimal  DRAINS: 1/4" Penrose right submandible  Cultures: Aerobic/anaerobic right mandible  SPECIMEN:  No Specimen  COUNTS:  YES  PLAN OF CARE: Discharge to home after PACU  PATIENT DISPOSITION:  PACU - hemodynamically stable.   PROCEDURE DETAILS: Dictation # [id number]  Georgia Lopes, DMD 05/03/2018 8:14 AM

## 2018-05-03 NOTE — H&P (Signed)
Anesthesia H&P Update: History and Physical Exam reviewed; patient is OK for planned anesthetic and procedure. ? ?

## 2018-05-03 NOTE — Discharge Instructions (Signed)
Ice to affected area for 2-3 days. °Warm salt water mouth rinses 4-5 times per day starting the day after surgery. °Soft diet, advance as tolerated. °No smoking for 2 weeks. °Follow-up visit with Dr. Elihu Milstein as scheduled. Call [phone] for problems. °

## 2018-05-03 NOTE — Anesthesia Procedure Notes (Signed)
Date/Time: 05/03/2018 8:22 AM Performed by: Minerva Ends, CRNA Oxygen Delivery Method: Simple face mask Placement Confirmation: breath sounds checked- equal and bilateral and positive ETCO2 Dental Injury: Teeth and Oropharynx as per pre-operative assessment

## 2018-05-03 NOTE — Progress Notes (Signed)
[facility] PROGRESS NOTE  Julion Gatt [facility] ZOX:[id number] DOB: 1981-06-29 DOA: 05/02/2018 PCP: Patient, [name]   Narrative: See my HPI 37 year old male long-standing hypertension smoker with failed outpatient management of dental caries 5/19 re-presented 5/24 with dental abscess Dr. Barbette Merino operated 5/20 4 AM   A & Plan Dental abscess-continue clindamycin IV 600 every 6 for now-Penrose drain management and further postop management as per oral surgeon-discontinue Decadron at this time as trismus is improved from pain management as per surgery  Hypertension-elevated to the 1 40-1 50 systolic range-we will reassess in a.m.-will probably need refills of outpatient medications    DVT prophylaxis: Lovenox code Status: Full code family Communication: Discussed with mother and girlfriend at bedside disposition Plan: Inpatient pending resolution   Mahala Menghini, MD  [facility] Direct contact: [phone] --Via amion app OR  --[email or web address]; password [email or web address]  7PM-7AM contact night coverage as above 05/03/2018, 10:27 AM  LOS: 1 day   Consultants:  Dr. Barbette Merino oral surgery  Procedures:  Incision and drainage of right masseteric space abscess extraction tooth 31  Antimicrobials:  Clindamycin  Interval history/Subjective: Awake alert just back from the OR-drain in place-patient thirsty-no distress Cannot feel the tongue Otherwise no complaints  Objective:  Vitals:  Vitals:   05/03/18 0930 05/03/18 0945  BP: 131/85 (!) 147/97  Pulse: 74 71  Resp: 12 12  Temp:  98.9 F (37.2 C)  SpO2: 99% 99%    Exam:  Constitutional:  . Appears calm and comfortable Eyes:  . pupils and irises appear normal . Normal lids and conjunctivae ENMT:  . grossly normal hearing  . Lips appear normal . external ears, nose appear normal . Oropharynx: Patient has a bandage over the left lower jaw area I did not examine the drain Neck:  . neck appears normal, no masses, normal ROM,  supple . no thyromegaly Respiratory:  . CTA bilaterally, no w/r/r.  . Respiratory effort normal. No retractions or accessory muscle use Cardiovascular:  . RRR, no m/r/g . No LE extremity edema   . Normal pedal pulses Skin:  . No rashes, lesions, ulcers . palpation of skin: no induration or nodules Neurologic:  . CN 2-12 intact . Sensation all 4 extremities intact Psychiatric:  . Mental status o Mood, affect appropriate o Orientation to person, place, time  . judgment and insight appear intact     I have personally reviewed the following:   Labs:  WBC 24  Imaging studies:  None further  Medical tests:  Anaerobic aerobic cultures are pending from 5/24  Test discussed with performing physician:  No  Decision to obtain old records:  Yes  Review and summation of old records:  Yes performed on admission  Scheduled Meds: . HYDROmorphone      . nicotine  21 mg Transdermal Daily   Continuous Infusions: . clindamycin (CLEOCIN) IV 0 mg (05/03/18 0318)  . lactated ringers 50 mL/hr at 05/03/18 0321    Active Problems:   Dental abscess   LOS: 1 day

## 2018-05-03 NOTE — H&P (Signed)
HISTORY AND PHYSICAL  Todd Macdonald is a 37 y.o. male patient with CC: Right jaw swelling.  HPI: Began having right jaw swelling approx 1 week ago. Seen in ER 5/19 and prescribed penicillin for dental abscess. Returned to ER yesterday with increasing swelling and trismus  1. Periapical abscess with facial involvement   2. Dental abscess     Past Medical History:  Diagnosis Date  . Closed angle glaucoma   . Hypertension     Current Facility-Administered Medications  Medication Dose Route Frequency Provider Last Rate Last Dose  . [MAR Hold] clindamycin (CLEOCIN) IVPB 600 mg  600 mg Intravenous Q6H Rhetta Mura, MD   Stopped at 05/03/18 0318  . [MAR Hold] hydrALAZINE (APRESOLINE) injection 5 mg  5 mg Intravenous Q4H PRN Rhetta Mura, MD      . Mitzi Hansen Hold] ibuprofen (ADVIL,MOTRIN) tablet 800 mg  800 mg Oral Q8H PRN Rhetta Mura, MD   800 mg at 05/02/18 2158  . lactated ringers infusion   Intravenous Continuous Rhetta Mura, MD 50 mL/hr at 05/03/18 0321    . [MAR Hold] morphine 2 MG/ML injection 1 mg  1 mg Intravenous Q3H PRN Rhetta Mura, MD   1 mg at 05/02/18 2158  . [MAR Hold] nicotine (NICODERM CQ - dosed in mg/24 hours) patch 21 mg  21 mg Transdermal Daily Rhetta Mura, MD   21 mg at 05/02/18 1953  . [MAR Hold] senna-docusate (Senokot-S) tablet 1 tablet  1 tablet Oral QHS PRN Rhetta Mura, MD       Allergies  Allergen Reactions  . Poison Oak Extract [Poison Oak Extract] Rash   Active Problems:   Dental abscess  Vitals: Blood pressure (!) 138/96, pulse 61, temperature 98 F (36.7 C), temperature source Oral, resp. rate 20, height 6' (1.829 m), weight 180 lb (81.6 kg), SpO2 100 %. Lab results: Results for orders placed or performed during the hospital encounter of 05/02/18 (from the past 24 hour(s))  CBC with Differential/Platelet     Status: Abnormal   Collection Time: 05/02/18  3:00 PM  Result Value Ref Range   WBC 19.7  (H) 4.0 - 10.5 K/uL   RBC 4.61 4.22 - 5.81 MIL/uL   Hemoglobin 14.7 13.0 - 17.0 g/dL   HCT 16.1 09.6 - 04.5 %   MCV 89.8 78.0 - 100.0 fL   MCH 31.9 26.0 - 34.0 pg   MCHC 35.5 30.0 - 36.0 g/dL   RDW 40.9 81.1 - 91.4 %   Platelets 341 150 - 400 K/uL   Neutrophils Relative % 83 %   Neutro Abs 16.4 (H) 1.7 - 7.7 K/uL   Lymphocytes Relative 6 %   Lymphs Abs 1.1 0.7 - 4.0 K/uL   Monocytes Relative 11 %   Monocytes Absolute 2.2 (H) 0.1 - 1.0 K/uL   Eosinophils Relative 0 %   Eosinophils Absolute 0.1 0.0 - 0.7 K/uL   Basophils Relative 0 %   Basophils Absolute 0.0 0.0 - 0.1 K/uL  I-stat chem 8, ed     Status: Abnormal   Collection Time: 05/02/18  3:09 PM  Result Value Ref Range   Sodium 139 135 - 145 mmol/L   Potassium 3.6 3.5 - 5.1 mmol/L   Chloride 104 101 - 111 mmol/L   BUN 4 (L) 6 - 20 mg/dL   Creatinine, Ser 7.82 0.61 - 1.24 mg/dL   Glucose, Bld 956 (H) 65 - 99 mg/dL   Calcium, Ion 2.13 0.86 - 1.40 mmol/L   TCO2 25  22 - 32 mmol/L   Hemoglobin 15.0 13.0 - 17.0 g/dL   HCT 16.1 09.6 - 04.5 %  Surgical pcr screen     Status: None   Collection Time: 05/02/18 11:45 PM  Result Value Ref Range   MRSA, PCR NEGATIVE NEGATIVE   Staphylococcus aureus NEGATIVE NEGATIVE  Comprehensive metabolic panel     Status: Abnormal   Collection Time: 05/03/18  3:43 AM  Result Value Ref Range   Sodium 137 135 - 145 mmol/L   Potassium 4.3 3.5 - 5.1 mmol/L   Chloride 103 101 - 111 mmol/L   CO2 25 22 - 32 mmol/L   Glucose, Bld 213 (H) 65 - 99 mg/dL   BUN 8 6 - 20 mg/dL   Creatinine, Ser 4.09 0.61 - 1.24 mg/dL   Calcium 9.3 8.9 - 81.1 mg/dL   Total Protein 8.0 6.5 - 8.1 g/dL   Albumin 3.6 3.5 - 5.0 g/dL   AST 20 15 - 41 U/L   ALT 18 17 - 63 U/L   Alkaline Phosphatase 88 38 - 126 U/L   Total Bilirubin 0.5 0.3 - 1.2 mg/dL   GFR calc non Af Amer >60 >60 mL/min   GFR calc Af Amer >60 >60 mL/min   Anion gap 9 5 - 15  CBC     Status: Abnormal   Collection Time: 05/03/18  3:43 AM  Result Value Ref  Range   WBC 24.4 (H) 4.0 - 10.5 K/uL   RBC 4.51 4.22 - 5.81 MIL/uL   Hemoglobin 13.9 13.0 - 17.0 g/dL   HCT 91.4 78.2 - 95.6 %   MCV 89.1 78.0 - 100.0 fL   MCH 30.8 26.0 - 34.0 pg   MCHC 34.6 30.0 - 36.0 g/dL   RDW 21.3 08.6 - 57.8 %   Platelets 366 150 - 400 K/uL  Protime-INR     Status: None   Collection Time: 05/03/18  3:43 AM  Result Value Ref Range   Prothrombin Time 14.4 11.4 - 15.2 seconds   INR 1.13   Renal function panel     Status: Abnormal   Collection Time: 05/03/18  3:43 AM  Result Value Ref Range   Sodium 137 135 - 145 mmol/L   Potassium 4.2 3.5 - 5.1 mmol/L   Chloride 103 101 - 111 mmol/L   CO2 23 22 - 32 mmol/L   Glucose, Bld 212 (H) 65 - 99 mg/dL   BUN 8 6 - 20 mg/dL   Creatinine, Ser 4.69 0.61 - 1.24 mg/dL   Calcium 9.3 8.9 - 62.9 mg/dL   Phosphorus 2.3 (L) 2.5 - 4.6 mg/dL   Albumin 3.7 3.5 - 5.0 g/dL   GFR calc non Af Amer >60 >60 mL/min   GFR calc Af Amer >60 >60 mL/min   Anion gap 11 5 - 15  Glucose, capillary     Status: Abnormal   Collection Time: 05/03/18  6:53 AM  Result Value Ref Range   Glucose-Capillary 159 (H) 65 - 99 mg/dL   Radiology Results: Ct Maxillofacial W Contrast  Result Date: 05/02/2018 CLINICAL DATA:  RIGHT facial swelling. EXAM: CT MAXILLOFACIAL WITH CONTRAST TECHNIQUE: Multidetector CT imaging of the maxillofacial structures was performed with intravenous contrast. Multiplanar CT image reconstructions were also generated. CONTRAST:  75mL OMNIPAQUE IOHEXOL 300 MG/ML  SOLN COMPARISON:  None. FINDINGS: Osseous: There is cortical dehiscence of the medial mandible posteriorly on the RIGHT at the angle of the mandible, related to periodontal disease affecting the RIGHT mandibular  molar wisdom tooth. Large area of dental caries affects this tooth as well as the maxillary wisdom tooth. No similar lesions elsewhere. Orbits: Negative Sinuses: Negative Soft tissues: There is a large abscess adjacent to the area of cortical dehiscence affecting the  masticator space posterior to the RIGHT mandible. Cross-section is approximately 3 x 3.5 x 2.5 cm. Additional focus of infection underneath the masseter, along the lateral body of the mandible on the RIGHT, small air bubble, see series 3, image 27. RIGHT masseter myositis is present. There is diffuse RIGHT facial swelling. Submental lymphadenopathy, without clear-cut findings of Ludwig's angina. Limited intracranial: Negative. 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 Regional inflammatory change. Extensive RIGHT mandibular and maxillary wisdom tooth dental caries. These results were called by telephone at the time of interpretation on 05/02/2018 at 3:57 pm to PA [facility] , who verbally acknowledged these results. Electronically Signed   By: Elsie Stain M.D.   On: 05/02/2018 15:58   General appearance: alert, cooperative and no distress Head: Normocephalic, without obvious abnormality, atraumatic Eyes: negative Nose: Nares normal. Septum midline. Mucosa normal. No drainage or sinus tenderness. Throat: Trismus to 15mm. Pharynx clear. Large carious lesion tooth # 31. No oral drainage. Neck: supple, symmetrical, trachea midline and Moderate  edema right masseter/submandibular area. Extreme tenderness to touch.  Assessment: Nonrestorable abscessed tooth # 31. Masseteric space infection  Plan: Incision and drainage of right masseteric space abscess. Extraction tooth # 31.    Ocie Doyne 05/03/2018

## 2018-05-03 NOTE — Anesthesia Procedure Notes (Signed)
Procedure Name: Intubation Date/Time: 05/03/2018 7:46 AM Performed by: Minerva Ends, CRNA Pre-anesthesia Checklist: Patient identified, Emergency Drugs available, Suction available and Patient being monitored Patient Re-evaluated:Patient Re-evaluated prior to induction Oxygen Delivery Method: Circle System Utilized Preoxygenation: Pre-oxygenation with 100% oxygen Induction Type: IV induction Ventilation: Mask ventilation without difficulty Laryngoscope Size: Miller and 1 Grade View: Grade I Tube type: Oral Tube size: 7.0 mm Number of attempts: 1 Airway Equipment and Method: Stylet Placement Confirmation: ETT inserted through vocal cords under direct vision,  positive ETCO2 and breath sounds checked- equal and bilateral Secured at: 22 cm Tube secured with: Tape Dental Injury: Teeth and Oropharynx as per pre-operative assessment  Comments: Smooth IV induction -- intubation AM CRNA atraumatic-- teeth and mouth as preop-- bilat BS Hyacinth Meeker

## 2018-05-03 NOTE — Op Note (Signed)
NAME: Todd Macdonald, NO[place] MEDICAL RECORD ZO:[id number] ACCOUNT [id number] DATE OF BIRTH:Sep 07, 1981 FACILITY: [facility] LOCATION: [facility] PHYSICIAN:WILLIAM E. Ashley Royalty., DDS  OPERATIVE REPORT  DATE OF PROCEDURE:  05/03/2018  PREOPERATIVE DIAGNOSIS:  Right mandibular masseter space infection abscessed tooth #32.  POSTOPERATIVE DIAGNOSIS:  Right mandibular masseter space infection abscessed tooth #32.  PROCEDURES:  Incision and drainage, right mandible, extraction  tooth #32.  SURGEON:  Boneta Lucks, Montez Hageman., DDS  ANESTHESIA:  General attending oral intubation.  DESCRIPTION OF PROCEDURE:  The patient was taken to the operating room and placed on the table in supine position.  General anesthesia was administered intravenously and an oral endotracheal tube was placed and secured.  The eyes were protected and the  patient was shaved, prepped and draped for surgery.  A timeout was performed.  Then, the right mandible was palpated and the most dependent point of swelling was just below the right angle of the mandible mid body.  Local anesthesia was administered for  skin, 2 percent lidocaine 1:100,000 epinephrine.  A 15 blade used to make an incision.  This was made approximately 2 cm below the inferior border of the mandible in the mid body region.  Blunt ended hemostats were used to dissect until purulent drainage  was found.  Cultures were taken for aerobic and anaerobic.  Hemostats were used and a cryo hemostat to track on the inferior border of the mandible posteriorly and lingually as well as buccally along the ramus of the mandible.  Approximately 8-10 mL  8-10 mL of purulent material was expressed.  Then, the area was irrigated and attention was then turned to the oral cavity.  A bite block was placed on the left side of the mouth and then a throat pack was placed.  Then, 2% lidocaine 1:100,000  epinephrine was infiltrated in a right inferior alveolar block and infiltration.  Tooth #32 was then  extracted using a 15 blade to make a circumferential incision.  Then, the periosteal elevator to reflect the periosteum and then the tooth was elevated  with a 301 elevator and removed from the mouth with a dental forceps.  The socket was curetted and irrigated.  The throat pack was removed and the extraoral incision was redressed.  The hemostat was then repositioned inside the area to confirm that there  were no additional loculations.  The area was irrigated.  Then, a quarter inch Penrose drain was placed and sutured to the skin with a 3-0 nylon.  Then, the area was dressed with  4 x 4's, and tape.    The patient was left in the care of anesthesia, awakened, and transferred to recovery room.  ESTIMATED BLOOD LOSS:  Minimal.  DRAINS:  Quarter inch Penrose right mandible.    CULTURE:  Aerobic, anaerobic right mandible.    SPECIMEN:  No specimen.    COUNTS:  Correct.  AN/NUANCE  D:05/03/2018 T:05/03/2018 JOB:[id number]

## 2018-05-04 MED ORDER — CLINDAMYCIN HCL 300 MG PO CAPS: 300.0000 mg | ORAL_CAPSULE | Freq: Three times a day (TID) | ORAL | 0 refills | Status: AC

## 2018-05-04 MED ORDER — OXYCODONE-ACETAMINOPHEN 5-325 MG PO TABS
1.0000 | ORAL_TABLET | Freq: Four times a day (QID) | ORAL | 0 refills | Status: AC | PRN
Start: 2018-05-04 — End: ?

## 2018-05-04 MED ORDER — AMLODIPINE BESYLATE 5 MG PO TABS: 5.0000 mg | ORAL_TABLET | Freq: Every day | ORAL | 3 refills | Status: DC

## 2018-05-04 MED ORDER — NICOTINE 21 MG/24HR TD PT24: 21.0000 mg | MEDICATED_PATCH | Freq: Every day | TRANSDERMAL | 0 refills | Status: AC

## 2018-05-04 MED ORDER — SENNOSIDES-DOCUSATE SODIUM 8.6-50 MG PO TABS: 1.0000 | ORAL_TABLET | Freq: Every evening | ORAL | Status: AC | PRN

## 2018-05-04 NOTE — Progress Notes (Signed)
[facility] PROGRESS NOTE:   SUBJECTIVE: Feeling much better. A lot of drainage. Eating well.  OBJECTIVE:  Vitals: Blood pressure 120/78, pulse (!) 58, temperature 98.2 F (36.8 C), temperature source Oral, resp. rate 18, height 6' (1.829 m), weight 180 lb (81.6 kg), SpO2 100 %. Lab results:No results found for this or any previous visit (from the past 24 hour(s)). Radiology Results: Ct Maxillofacial W Contrast  Result Date: 05/02/2018 CLINICAL DATA:  RIGHT facial swelling. EXAM: CT MAXILLOFACIAL WITH CONTRAST TECHNIQUE: Multidetector CT imaging of the maxillofacial structures was performed with intravenous contrast. Multiplanar CT image reconstructions were also generated. CONTRAST:  75mL OMNIPAQUE IOHEXOL 300 MG/ML  SOLN COMPARISON:  None. FINDINGS: Osseous: There is cortical dehiscence of the medial mandible posteriorly on the RIGHT at the angle of the mandible, related to periodontal disease affecting the RIGHT mandibular molar wisdom tooth. Large area of dental caries affects this tooth as well as the maxillary wisdom tooth. No similar lesions elsewhere. Orbits: Negative Sinuses: Negative Soft tissues: There is a large abscess adjacent to the area of cortical dehiscence affecting the masticator space posterior to the RIGHT mandible. Cross-section is approximately 3 x 3.5 x 2.5 cm. Additional focus of infection underneath the masseter, along the lateral body of the mandible on the RIGHT, small air bubble, see series 3, image 27. RIGHT masseter myositis is present. There is diffuse RIGHT facial swelling. Submental lymphadenopathy, without clear-cut findings of Ludwig's angina. Limited intracranial: Negative. 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 Regional inflammatory change. Extensive RIGHT mandibular and maxillary wisdom tooth dental caries. These  results were called by telephone at the time of interpretation on 05/02/2018 at 3:57 pm to PA [facility] , who verbally acknowledged these results. Electronically Signed   By: Elsie Stain M.D.   On: 05/02/2018 15:58   General appearance: alert, cooperative and no distress Head: Normocephalic, without obvious abnormality, atraumatic Eyes: negative Throat: Extraction site hemostatic, no purulent exudate. Pharynx clear. Trismus to 20mm Neck: moderate submandibular edema. Drain intact actively draining  ASSESSMENT: s/p I and D, dental extraction. Much improved  PLAN: Stable for discharge from oral surgery standpoint. PO antibiotics and pain medicine. Dressing changes. Will remove drain in office Tuesday, May 28.    Ocie Doyne 05/04/2018

## 2018-05-04 NOTE — Discharge Summary (Signed)
Physician Discharge Summary  [facility] ZOX:[id number] DOB: 1981/08/01 DOA: 05/02/2018  PCP: Todd Macdonald  Admit date: 05/02/2018 Discharge date: 05/04/2018  Time spent: 30 minutes  Recommendations for Outpatient Follow-up:  1. Needs follow up with Dr. Barbette Merino as OP  Discharge Diagnoses:  Active Problems:   Dental abscess   Discharge Condition: improved  Diet recommendation: soft  Filed Weights   05/02/18 1417  Weight: 81.6 kg (180 lb)    History of present illness:  37 year old male long-standing hypertension smoker with failed outpatient management of dental caries 5/19 re-presented 5/24 with dental abscess Dr. Barbette Merino operated 5/20 4 AM  Hospital Course:  Dental abscess--Penrose drain management and further postop management as Macdonald oral surgeon-stabilized for d/c clinda to complete another 10 days Pain rx with nsaids and opiates on d/c  Hypertension-elevated to the 1 40-1 50 systolic range-.-will probably need refills of outpatient management--given Rx for Amlodipine 5 on d.c   Procedures:  Dental surgery removal tooth #[id number]   Consultations:  Dr. Barbette Merino  Discharge Exam: Vitals:   05/03/18 2221 05/04/18 0516  BP: 124/77 120/78  Pulse: 81 (!) 58  Resp: 16 18  Temp: 98.3 F (36.8 C) 98.2 F (36.8 C)  SpO2: 93% 100%    General: eomi swelling in R jaw much improved Cardiovascular: s1 s2 no m/r/g Respiratory: clear without added sound  Discharge Instructions   Discharge Instructions    Diet - low sodium heart healthy   Complete by:  As directed    Discharge instructions   Complete by:  As directed    Follow with oral surgeon as directed Take all antibiotics and complete them nsaids first choice, opiates subsequently   Increase activity slowly   Complete by:  As directed      Allergies as of 05/04/2018      Reactions   Poison Oak Extract [poison Oak Extract] Rash      Medication List    STOP taking these medications   penicillin v  potassium 500 MG tablet Commonly known as:  VEETID   traMADol 50 MG tablet Commonly known as:  ULTRAM     TAKE these medications   amLODipine 5 MG tablet Commonly known as:  NORVASC Take 1 tablet (5 mg total) by mouth daily.   clindamycin 300 MG capsule Commonly known as:  CLEOCIN Take 1 capsule (300 mg total) by mouth 3 (three) times daily for 10 days.   ibuprofen 800 MG tablet Commonly known as:  ADVIL,MOTRIN Take 1 tablet (800 mg total) by mouth every 8 (eight) hours as needed.   nicotine 21 mg/24hr patch Commonly known as:  NICODERM CQ - dosed in mg/24 hours Place 1 patch (21 mg total) onto the skin daily.   oxyCODONE-acetaminophen 5-325 MG tablet Commonly known as:  PERCOCET/ROXICET Take 1-2 tablets by mouth every 6 (six) hours as needed for moderate pain (unrelieved by Ibuprofen).   senna-docusate 8.6-50 MG tablet Commonly known as:  Senokot-S Take 1 tablet by mouth at bedtime as needed for mild constipation.      Allergies  Allergen Reactions  . Poison Oak Extract [Poison Oak Extract] Rash      The results of significant diagnostics from this hospitalization (including imaging, microbiology, ancillary and laboratory) are listed below for reference.    Significant Diagnostic Studies: Ct Maxillofacial W Contrast  Result Date: 05/02/2018 CLINICAL DATA:  RIGHT facial swelling. EXAM: CT MAXILLOFACIAL WITH CONTRAST TECHNIQUE: Multidetector CT imaging of the maxillofacial structures was performed with intravenous  contrast. Multiplanar CT image reconstructions were also generated. CONTRAST:  75mL OMNIPAQUE IOHEXOL 300 MG/ML  SOLN COMPARISON:  None. FINDINGS: Osseous: There is cortical dehiscence of the medial mandible posteriorly on the RIGHT at the angle of the mandible, related to periodontal disease affecting the RIGHT mandibular molar wisdom tooth. Large area of dental caries affects this tooth as well as the maxillary wisdom tooth. No similar lesions elsewhere.  Orbits: Negative Sinuses: Negative Soft tissues: There is a large abscess adjacent to the area of cortical dehiscence affecting the masticator space posterior to the RIGHT mandible. Cross-section is approximately 3 x 3.5 x 2.5 cm. Additional focus of infection underneath the masseter, along the lateral body of the mandible on the RIGHT, small air bubble, see series 3, image 27. RIGHT masseter myositis is present. There is diffuse RIGHT facial swelling. Submental lymphadenopathy, without clear-cut findings of Ludwig's angina. Limited intracranial: Negative. 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 Regional inflammatory change. Extensive RIGHT mandibular and maxillary wisdom tooth dental caries. These results were called by telephone at the time of interpretation on 05/02/2018 at 3:57 pm to Todd Macdonald , who verbally acknowledged these results. Electronically Signed   By: Elsie Stain M.D.   On: 05/02/2018 15:58    Microbiology: Recent Results (from the past 240 hour(s))  Surgical pcr screen     Status: None   Collection Time: 05/02/18 11:45 PM  Result Value Ref Range Status   MRSA, PCR NEGATIVE NEGATIVE Final   Staphylococcus aureus NEGATIVE NEGATIVE Final    Comment: (NOTE) The Xpert SA Assay (FDA approved for NASAL specimens in patients 26 years of age and older), is one component of a comprehensive surveillance program. It is not intended to diagnose infection nor to guide or monitor treatment. Performed at [facility], [address], [place], [place] [address]   Aerobic/Anaerobic Culture (surgical/deep wound)     Status: None (Preliminary result)   Collection Time: 05/03/18  8:03 AM  Result Value Ref Range Status   Specimen Description   Final    ABSCESS Performed at [facility], [address],  [place], [place] [address]    Special Requests   Final    NONE Performed at [facility], [address] [place], [place] [address]    Gram Stain   Final    ABUNDANT WBC PRESENT, PREDOMINANTLY PMN ABUNDANT GRAM NEGATIVE RODS ABUNDANT GRAM POSITIVE COCCI IN PAIRS IN CLUSTERS MODERATE GRAM POSITIVE RODS    Culture   Final    CULTURE REINCUBATED FOR BETTER GROWTH Performed at [facility], [address] [place], [place] [address]    Report Status PENDING  Incomplete     Labs: Basic Metabolic Panel: Recent Labs  Lab 05/02/18 1509 05/03/18 0343  NA 139 137  137  K 3.6 4.3  4.2  CL 104 103  103  CO2  --  25  23  GLUCOSE 130* 213*  212*  BUN 4* 8  8  CREATININE 0.70 0.65  0.79  CALCIUM  --  9.3  9.3  PHOS  --  2.3*   Liver Function Tests: Recent Labs  Lab 05/03/18 0343  AST 20  ALT 18  ALKPHOS 88  BILITOT 0.5  PROT 8.0  ALBUMIN 3.6  3.7   No results for input(s): LIPASE, AMYLASE in the last 168  hours. No results for input(s): AMMONIA in the last 168 hours. CBC: Recent Labs  Lab 05/02/18 1500 05/02/18 1509 05/03/18 0343  WBC 19.7*  --  24.4*  NEUTROABS 16.4*  --   --   HGB 14.7 15.0 13.9  HCT 41.4 44.0 40.2  MCV 89.8  --  89.1  PLT 341  --  366   Cardiac Enzymes: No results for input(s): CKTOTAL, CKMB, CKMBINDEX, TROPONINI in the last 168 hours. BNP: BNP (last 3 results) No results for input(s): BNP in the last 8760 hours.  ProBNP (last 3 results) No results for input(s): PROBNP in the last 8760 hours.  CBG: Recent Labs  Lab 05/03/18 0653 05/03/18 0837  GLUCAP 159* 203*       Signed:  Rhetta Mura MD   [facility] 05/04/2018, 11:01 AM

## 2018-05-08 LAB — AEROBIC/ANAEROBIC CULTURE W GRAM STAIN (SURGICAL/DEEP WOUND)

## 2018-05-08 LAB — AEROBIC/ANAEROBIC CULTURE (SURGICAL/DEEP WOUND)

## 2019-01-02 IMAGING — CT CT MAXILLOFACIAL W/ CM
3 of 4 series · 15 of 47 positions shown, 18 images · IV contrast (omnipaque)
Comparison: None.

CLINICAL DATA: RIGHT facial swelling.

EXAM:
CT MAXILLOFACIAL WITH CONTRAST
TECHNIQUE: Multidetector CT imaging of the maxillofacial structures was
performed with intravenous contrast. Multiplanar CT image
reconstructions were also generated.
CONTRAST:  75mL OMNIPAQUE IOHEXOL 300 MG/ML  SOLN

[Series 3: max soft · axial · 0.32mm/px · z∈[+1260,+1400]mm · 11 of 82 slices shown, 14 images]
[im 6/82  brain]
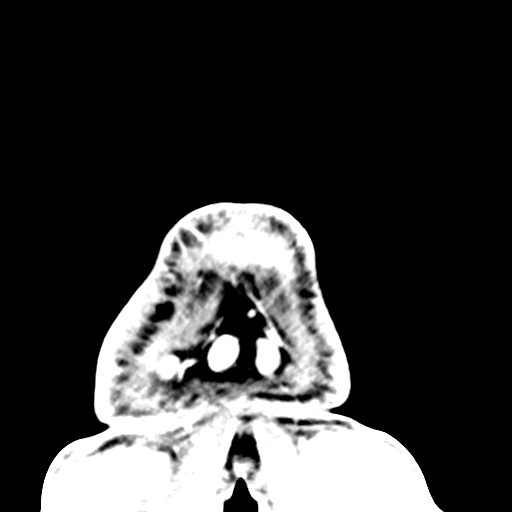
[im 6/82  bone]
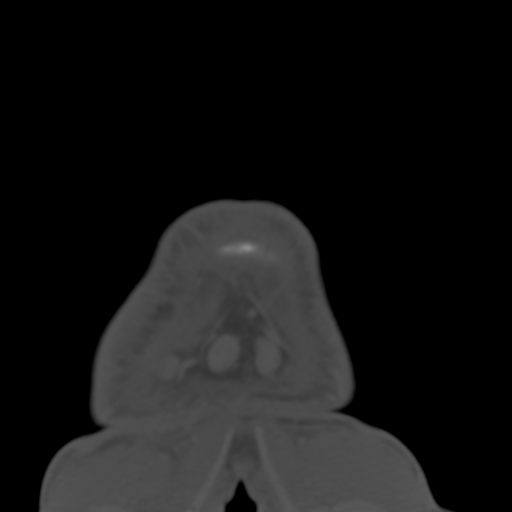
[im 12/82  bone]
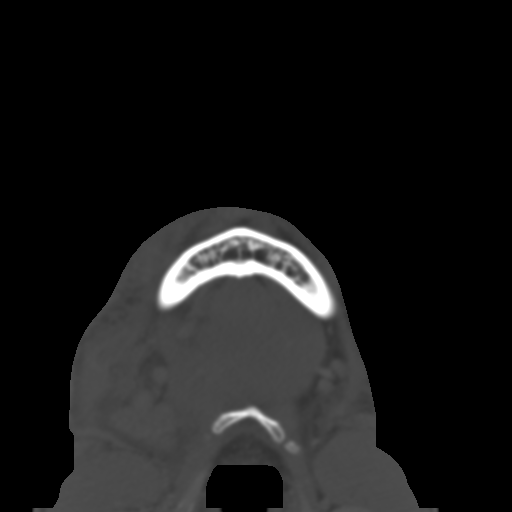
[im 20/82  bone]
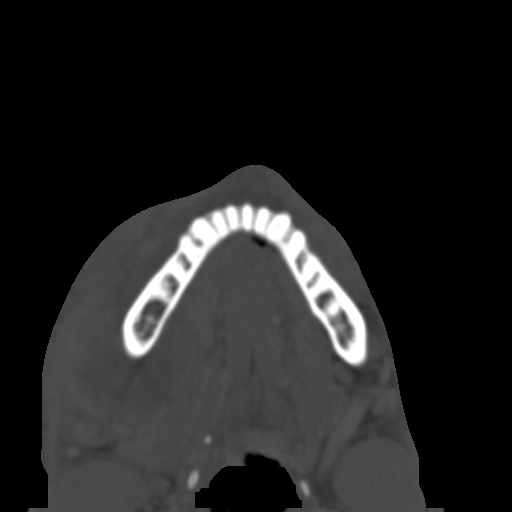
[im 26/82  bone]
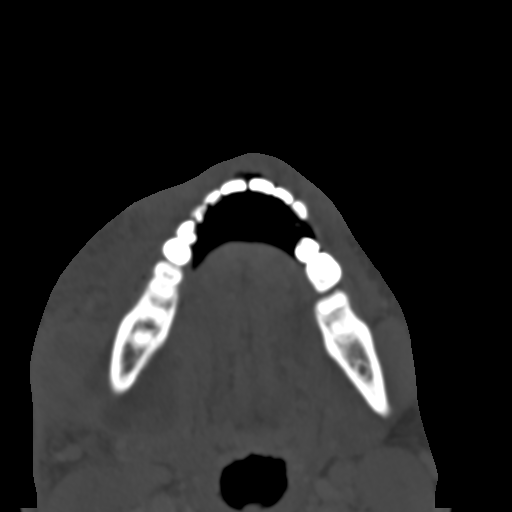
[im 34/82  brain]
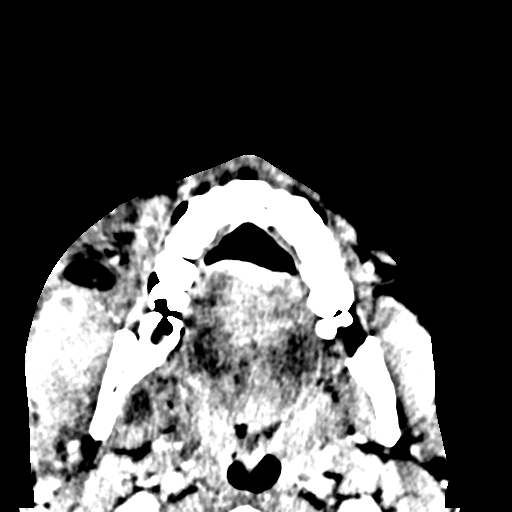
[im 34/82  bone]
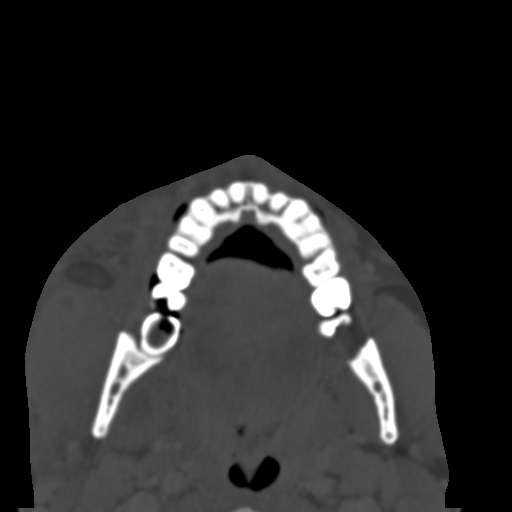
[im 42/82  bone]
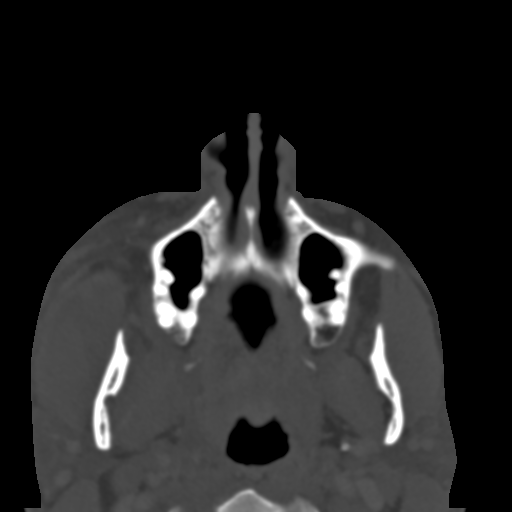
[im 48/82  bone]
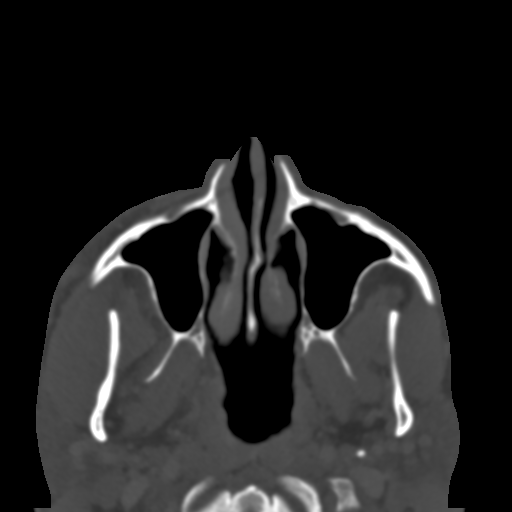
[im 56/82  bone]
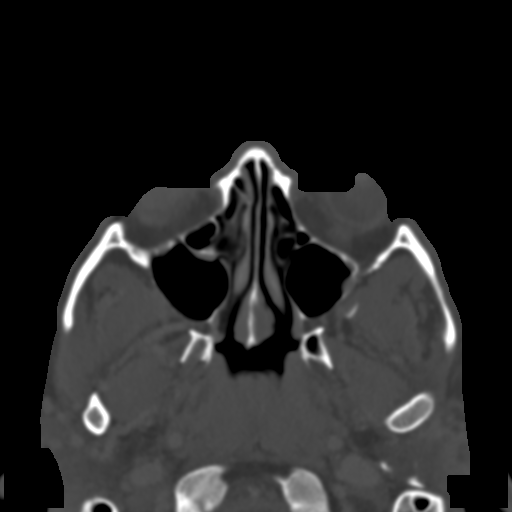
[im 62/82  brain]
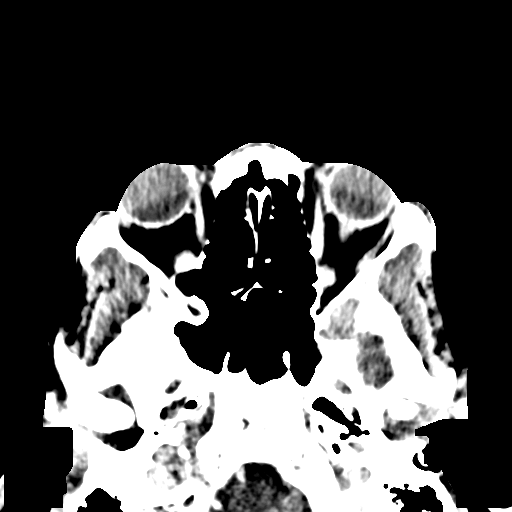
[im 62/82  bone]
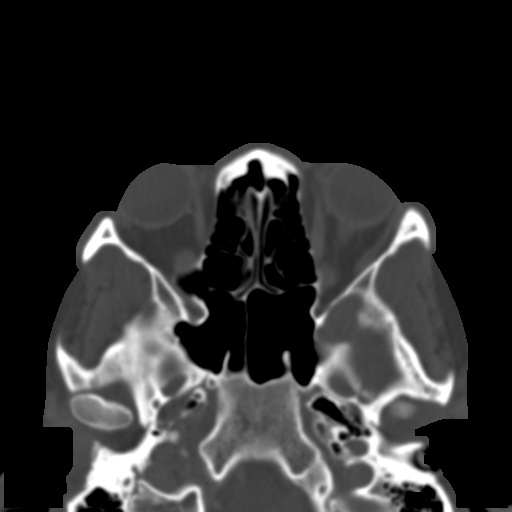
[im 70/82  bone]
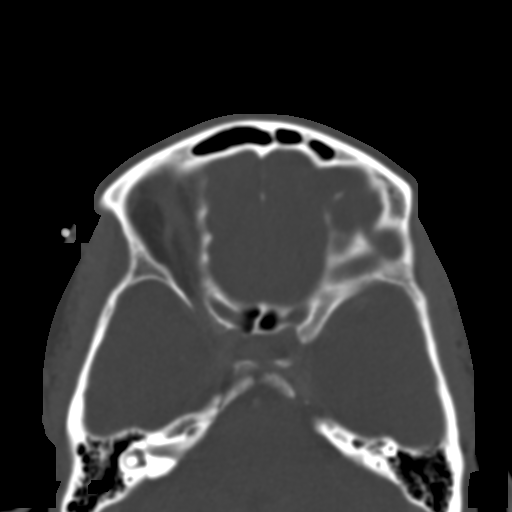
[im 76/82  bone]
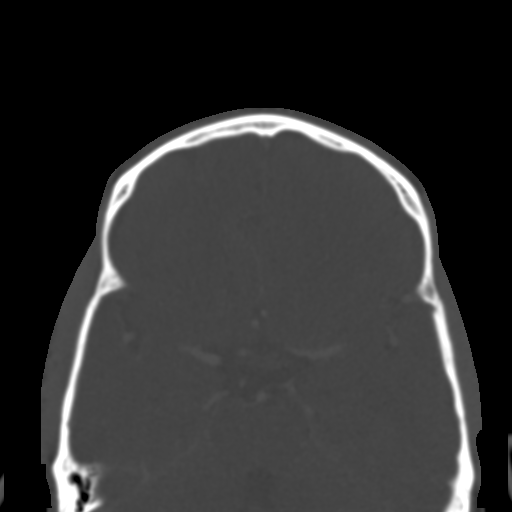

[Series 7: coronal soft · coronal · 0.32mm/px · 3 of 63 slices shown]
[im 21/63  bone]
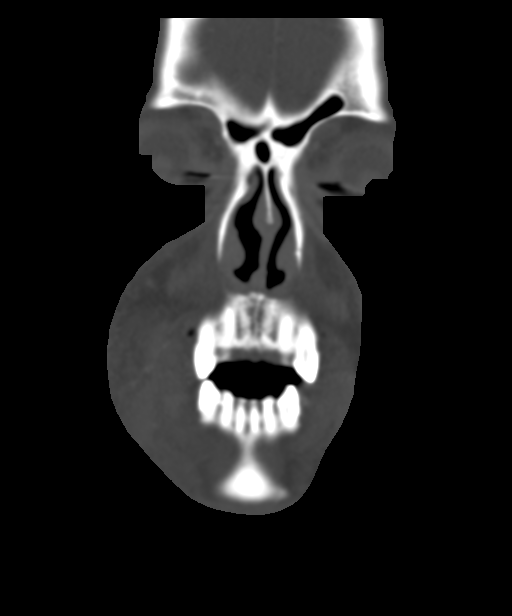
[im 28/63  bone]
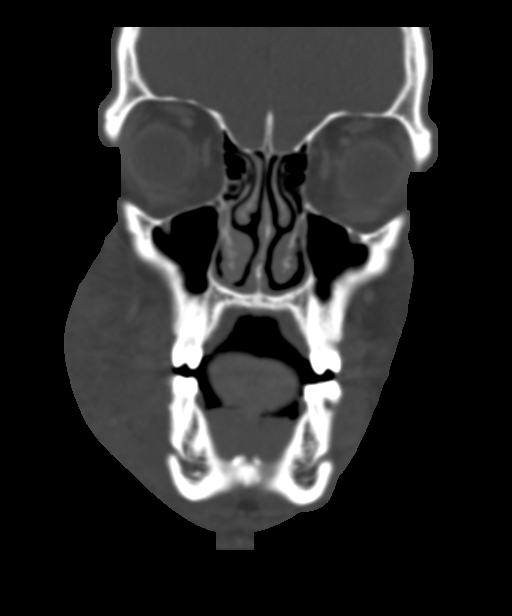
[im 35/63  bone]
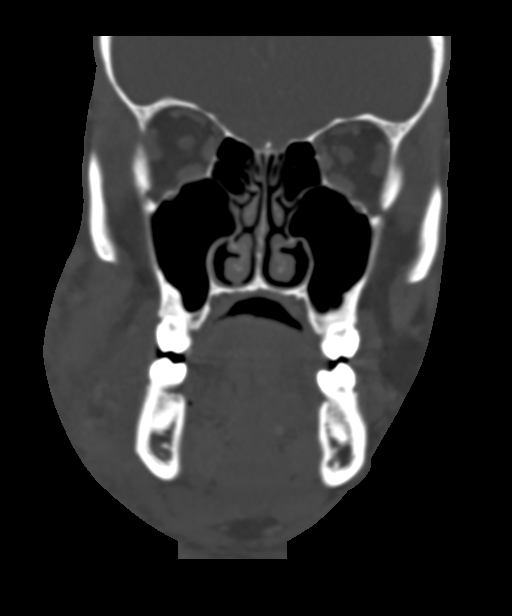

[Series 10: sagittal bone · sagittal · 0.38mm/px · 1 of 77 slices shown]
[im 39/77  bone]
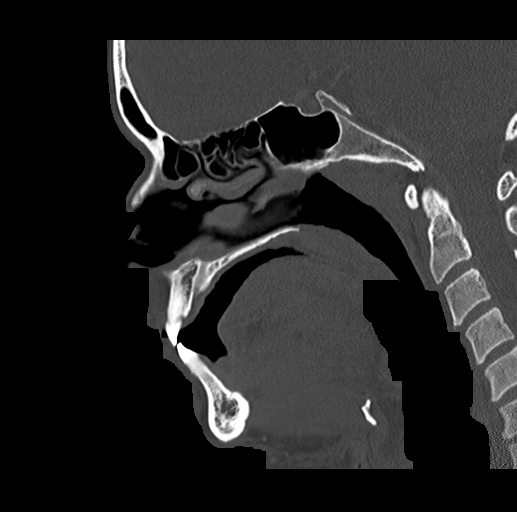

[15 of 47 positions shown; findings below may reference images not displayed]

FINDINGS: Osseous: There is cortical dehiscence of the medial mandible
posteriorly on the RIGHT at the angle of the mandible, related to
periodontal disease affecting the RIGHT mandibular molar wisdom
tooth. Large area of dental caries affects this tooth as well as the
maxillary wisdom tooth. No similar lesions elsewhere.

Orbits: Negative

Sinuses: Negative

Soft tissues: There is a large abscess adjacent to the area of
cortical dehiscence affecting the masticator space posterior to the
RIGHT mandible. Cross-section is approximately 3 x 3.5 x 2.5 cm.
Additional focus of infection underneath the masseter, along the
lateral body of the mandible on the RIGHT, small air bubble, see
series 3, image 27. RIGHT masseter myositis is present. There is
diffuse RIGHT facial swelling. Submental lymphadenopathy, without
clear-cut findings of Ludwig's angina.

Limited intracranial: Negative.
IMPRESSION: Periodontal disease affecting the RIGHT mandibular molar wisdom
tooth, mandibular cortical dehiscence, with a large 3 cm abscess
inferior and to the angle of the mandible. Regional inflammatory
change. Extensive RIGHT mandibular and maxillary wisdom tooth dental
caries.

These results were called by telephone at the time of interpretation
on 05/02/2018 at [DATE] to PA IVELISSE BRATCHER , who verbally acknowledged
these results.

## 2024-02-20 ENCOUNTER — Emergency Department (HOSPITAL_COMMUNITY)
Admission: EM | Admit: 2024-02-20 | Discharge: 2024-02-20 | Disposition: A | Discharge status: Home / Self Care | Attending: Emergency Medicine | Admitting: Emergency Medicine

## 2024-02-20 ENCOUNTER — Emergency Department (HOSPITAL_COMMUNITY)

## 2024-02-20 ENCOUNTER — Encounter (HOSPITAL_COMMUNITY): Payer: Self-pay | Admitting: Emergency Medicine

## 2024-02-20 ENCOUNTER — Other Ambulatory Visit: Payer: Self-pay

## 2024-02-20 DIAGNOSIS — I10 Essential (primary) hypertension: Secondary | ICD-10-CM | POA: Diagnosis not present

## 2024-02-20 DIAGNOSIS — Z79899 Other long term (current) drug therapy: Secondary | ICD-10-CM | POA: Diagnosis not present

## 2024-02-20 DIAGNOSIS — R519 Headache, unspecified: Secondary | ICD-10-CM

## 2024-02-20 LAB — TROPONIN I (HIGH SENSITIVITY)
Troponin I (High Sensitivity): 5 ng/L (ref <18)
Troponin I (High Sensitivity): 6 ng/L (ref <18)

## 2024-02-20 LAB — CBC WITH DIFFERENTIAL/PLATELET
Abs Immature Granulocytes: 0.06 10*3/uL (ref 0.00–0.07)
Basophils Absolute: 0.1 10*3/uL (ref 0.0–0.1)
Basophils Relative: 1 %
Eosinophils Absolute: 0 10*3/uL (ref 0.0–0.5)
Eosinophils Relative: 0 %
HCT: 43.8 % (ref 39.0–52.0)
Hemoglobin: 14.8 g/dL (ref 13.0–17.0)
Immature Granulocytes: 1 %
Lymphocytes Relative: 16 %
Lymphs Abs: 2 10*3/uL (ref 0.7–4.0)
MCH: 30.2 pg (ref 26.0–34.0)
MCHC: 33.8 g/dL (ref 30.0–36.0)
MCV: 89.4 fL (ref 80.0–100.0)
Monocytes Absolute: 1 10*3/uL (ref 0.1–1.0)
Monocytes Relative: 8 %
Neutro Abs: 9.7 10*3/uL — ABNORMAL HIGH (ref 1.7–7.7)
Neutrophils Relative %: 74 %
Platelets: 319 10*3/uL (ref 150–400)
RBC: 4.9 MIL/uL (ref 4.22–5.81)
RDW: 14.5 % (ref 11.5–15.5)
WBC: 12.9 10*3/uL — ABNORMAL HIGH (ref 4.0–10.5)
nRBC: 0 % (ref 0.0–0.2)

## 2024-02-20 LAB — COMPREHENSIVE METABOLIC PANEL
ALT: 22 U/L (ref 0–44)
AST: 20 U/L (ref 15–41)
Albumin: 4.2 g/dL (ref 3.5–5.0)
Alkaline Phosphatase: 76 U/L (ref 38–126)
Anion gap: 9 (ref 5–15)
BUN: 7 mg/dL (ref 6–20)
CO2: 23 mmol/L (ref 22–32)
Calcium: 9.1 mg/dL (ref 8.9–10.3)
Chloride: 102 mmol/L (ref 98–111)
Creatinine, Ser: 0.76 mg/dL (ref 0.61–1.24)
GFR, Estimated: >60 mL/min (ref >60)
Glucose, Bld: 115 mg/dL — ABNORMAL HIGH (ref 70–99)
Potassium: 3.3 mmol/L — ABNORMAL LOW (ref 3.5–5.1)
Sodium: 134 mmol/L — ABNORMAL LOW (ref 135–145)
Total Bilirubin: 1.3 mg/dL — ABNORMAL HIGH (ref 0.0–1.2)
Total Protein: 7.7 g/dL (ref 6.5–8.1)

## 2024-02-20 LAB — URINALYSIS, ROUTINE W REFLEX MICROSCOPIC
Bacteria, UA: NONE SEEN
Bilirubin Urine: NEGATIVE
Glucose, UA: NEGATIVE mg/dL
Ketones, ur: NEGATIVE mg/dL
Leukocytes,Ua: NEGATIVE
Nitrite: NEGATIVE
Protein, ur: NEGATIVE mg/dL
Specific Gravity, Urine: 1.014 (ref 1.005–1.030)
pH: 5 (ref 5.0–8.0)

## 2024-02-20 MED ORDER — MORPHINE SULFATE (PF) 4 MG/ML IV SOLN
4.0000 mg | Freq: Once | INTRAVENOUS | Status: AC
  Administered 2024-02-20: 4 mg via INTRAVENOUS
  Filled 2024-02-20: qty 1

## 2024-02-20 MED ORDER — METOCLOPRAMIDE HCL 5 MG/ML IJ SOLN
10.0000 mg | Freq: Once | INTRAMUSCULAR | Status: AC
  Administered 2024-02-20: 10 mg via INTRAVENOUS
  Filled 2024-02-20: qty 2

## 2024-02-20 MED ORDER — AMLODIPINE BESYLATE 5 MG PO TABS
5.0000 mg | ORAL_TABLET | Freq: Once | ORAL | Status: AC
  Administered 2024-02-20: 5 mg via ORAL
  Filled 2024-02-20: qty 1

## 2024-02-20 MED ORDER — AMLODIPINE BESYLATE 5 MG PO TABS: 5.0000 mg | ORAL_TABLET | Freq: Every day | ORAL | 1 refills | Status: AC

## 2024-02-20 NOTE — ED Provider Notes (Signed)
 [place] EMERGENCY DEPARTMENT AT [facility] Provider Note   CSN: [id number] Arrival date & time: 02/20/24  0441     History  Chief Complaint  Patient presents with   Migraine    Todd Macdonald is a 43 y.o. male who presents with report of persistent pounding left sided headache x 7 days with associated photosensitivity and nausea without vomiting.  Also endorses history of high blood pressure but states he has not been on medication for this though his chart does list amlodipine prescribed in 2019.  He is not on any medications daily.  History of closed angle glaucoma.  Denies chest pain shortness of breath palpitations lower extremity swelling, blurry or double vision, no scotomas or aura symptoms.  HPI     Home Medications Prior to Admission medications   Medication Sig Start Date End Date Taking? Authorizing Provider  amLODipine (NORVASC) 5 MG tablet Take 1 tablet (5 mg total) by mouth daily. 05/04/18 09/01/18  Rhetta Mura, MD  ibuprofen (ADVIL,MOTRIN) 800 MG tablet Take 1 tablet (800 mg total) by mouth every 8 (eight) hours as needed. 04/28/18   Lawyer, Cristal Deer, PA-C  nicotine (NICODERM CQ - DOSED IN MG/24 HOURS) 21 mg/24hr patch Place 1 patch (21 mg total) onto the skin daily. 05/04/18   Rhetta Mura, MD  oxyCODONE-acetaminophen (PERCOCET/ROXICET) 5-325 MG tablet Take 1-2 tablets by mouth every 6 (six) hours as needed for moderate pain (unrelieved by Ibuprofen). 05/04/18   Rhetta Mura, MD  senna-docusate (SENOKOT-S) 8.6-50 MG tablet Take 1 tablet by mouth at bedtime as needed for mild constipation. 05/04/18   Rhetta Mura, MD      Allergies    Poison oak extract [poison oak extract]    Review of Systems   Review of Systems  Eyes:  Positive for photophobia. Negative for pain, discharge, redness, itching and visual disturbance.  Neurological:  Positive for headaches. Negative for dizziness, tremors, syncope, weakness and  light-headedness.    Physical Exam Updated Vital Signs BP (!) 168/118 (BP Location: Left Arm)   Pulse 72   Temp 98 F (36.7 C) (Oral)   Resp 16   SpO2 100%  Physical Exam Vitals and nursing note reviewed.  Constitutional:      Appearance: He is not ill-appearing or toxic-appearing.  HENT:     Head: Normocephalic and atraumatic.     Mouth/Throat:     Mouth: Mucous membranes are moist.     Pharynx: No oropharyngeal exudate or posterior oropharyngeal erythema.  Eyes:     General: Lids are normal. Vision grossly intact.        Right eye: No discharge.        Left eye: No discharge.     Extraocular Movements: Extraocular movements intact.     Conjunctiva/sclera: Conjunctivae normal.     Pupils: Pupils are equal, round, and reactive to light.  Neck:     Trachea: Trachea and phonation normal.     Meningeal: Brudzinski's sign and Kernig's sign absent.  Cardiovascular:     Rate and Rhythm: Normal rate and regular rhythm.     Pulses: Normal pulses.     Heart sounds: Normal heart sounds. No murmur heard. Pulmonary:     Effort: Pulmonary effort is normal. No respiratory distress.     Breath sounds: Normal breath sounds. No wheezing or rales.  Abdominal:     General: Bowel sounds are normal. There is no distension.     Palpations: Abdomen is soft.  Tenderness: There is no abdominal tenderness. There is no guarding or rebound.  Musculoskeletal:        General: No deformity.     Cervical back: Neck supple.     Right lower leg: No edema.     Left lower leg: No edema.  Skin:    General: Skin is warm and dry.     Capillary Refill: Capillary refill takes less than 2 seconds.  Neurological:     General: No focal deficit present.     Mental Status: He is alert and oriented to person, place, and time. Mental status is at baseline.  Psychiatric:        Mood and Affect: Mood normal.     ED Results / Procedures / Treatments   Labs (all labs ordered are listed, but only abnormal  results are displayed) Labs Reviewed  CBC WITH DIFFERENTIAL/PLATELET  COMPREHENSIVE METABOLIC PANEL  URINALYSIS, ROUTINE W REFLEX MICROSCOPIC  TROPONIN I (HIGH SENSITIVITY)    EKG None  Radiology No results found.  Procedures Procedures    Medications Ordered in ED Medications  metoCLOPramide (REGLAN) injection 10 mg (has no administration in time range)  morphine (PF) 4 MG/ML injection 4 mg (has no administration in time range)    ED Course/ Medical Decision Making/ A&P                                 Medical Decision Making 43 year old male who presents with pounding left-sided headache x 1 week.  Notably hypertensive on intake medicines otherwise normal.  Cardiopulmonary and abdominal exams are benign.  Neurologic exam is nonfocal.  PERRL, EOMI, patient is sensitive to light.  Emergent considerations for headache include subarachnoid hemorrhage, meningitis, temporal arteritis, glaucoma, cerebral ischemia, carotid/vertebral dissection, intracranial tumor, Venous sinus thrombosis, carbon monoxide poisoning, acute or chronic subdural hemorrhage.  Other considerations include: Migraine, Cluster headache, Tension headache, Hypertension, Caffeine / alcohol / drug withdrawal, Pseudotumor cerebri, Arteriovenous malformation, Head injury, Neurocysticercosis, Post-lumbar puncture, Preeclampsia, Cervical arthritis, Refractive error causing strain, Dental abscess, Sinusitis, Otitis media, Temporomandibular joint syndrome, Depression, Somatoform disorder (eg, somatization) Trigeminal neuralgia, Glossopharyngeal neuralgia.   Amount and/or Complexity of Data Reviewed Labs: ordered. Radiology: ordered.  Risk Prescription drug management.  Clinical picture most consistent with migraine versus HTN emergency.   Care of this patient signed out to oncoming ED provider Karie Mainland, PA-C at time of shift change. All pertinent HPI, physical exam, and laboratory findings were discussed with them prior  to my departure. Disposition of patient pending completion of workup, reevaluation, and clinical judgement of oncoming ED provider. Pending completion of labs, CT, and medication administration.   Todd Macdonald voiced understanding of his medical evaluation and treatment plan. Each of their questions answered to their expressed satisfaction.    This chart was dictated using voice recognition software, Dragon. Despite the best efforts of this provider to proofread and correct errors, errors may still occur which can change documentation meaning.         Final Clinical Impression(s) / ED Diagnoses Final diagnoses:  None    Rx / DC Orders ED Discharge Orders     None         Sherrilee Gilles 02/20/24 0604    Shon Baton, MD 02/22/24 2333

## 2024-02-20 NOTE — ED Provider Notes (Addendum)
 Signout received on this 43 year old male.  See previous note for full details. Physical Exam  BP (!) 137/95   Pulse 70   Temp 98 F (36.7 C) (Oral)   Resp 16   SpO2 97%     Procedures  Procedures  ED Course / MDM    Medical Decision Making Amount and/or Complexity of Data Reviewed Labs: ordered. Radiology: ordered.  Risk Prescription drug management.   CT without concerning findings of a headache.  Remainder of the blood work without acute concerns.  Normal troponins.  No evidence of hypertensive emergency.  Extensive discussion had regarding complications of uncontrolled hypertension.  Will start on amlodipine.  He is agreeable.  Will give referral to internal medicine service.  Headache improved on reevaluation.     Marita Kansas, PA-C 02/20/24 0834    Marita Kansas, PA-C 02/20/24 0835    Rexford Maus, DO 02/20/24 [phone]

## 2024-02-20 NOTE — ED Triage Notes (Signed)
 Pt c/o cluster migraines x 1 week. Reports home meds aren't working. Pain 10/10. N/V. Sensitivity to light

## 2024-02-20 NOTE — Discharge Instructions (Signed)
 No concerning cause of your headache.  Your headache improved after medicines.  He also received a dose of blood pressure medicine.  Additional medicine sent to the pharmacy for you.  Please establish care with the clinic listed above.  For any emergent symptoms return to the emergency room.
